# Patient Record
Sex: Female | Born: 1945 | Race: White | Hispanic: No | Smoking: Never smoker
Health system: Southern US, Community
[De-identification: ages and names within clinical notes are randomized; demographics above are authoritative.]

## PROBLEM LIST (undated history)

## (undated) DIAGNOSIS — I10 Essential (primary) hypertension: Secondary | ICD-10-CM

## (undated) DIAGNOSIS — I639 Cerebral infarction, unspecified: Secondary | ICD-10-CM

## (undated) DIAGNOSIS — I4891 Unspecified atrial fibrillation: Secondary | ICD-10-CM

## (undated) DIAGNOSIS — R0600 Dyspnea, unspecified: Secondary | ICD-10-CM

## (undated) DIAGNOSIS — M549 Dorsalgia, unspecified: Secondary | ICD-10-CM

## (undated) DIAGNOSIS — G4730 Sleep apnea, unspecified: Secondary | ICD-10-CM

## (undated) DIAGNOSIS — R569 Unspecified convulsions: Secondary | ICD-10-CM

## (undated) DIAGNOSIS — I499 Cardiac arrhythmia, unspecified: Secondary | ICD-10-CM

## (undated) HISTORY — DX: Unspecified atrial fibrillation: I48.91

## (undated) HISTORY — DX: Unspecified convulsions: R56.9

## (undated) HISTORY — DX: Sleep apnea, unspecified: G47.30

## (undated) HISTORY — DX: Cerebral infarction, unspecified: I63.9

## (undated) HISTORY — PX: SHOULDER ARTHROSCOPY: SHX128

## (undated) HISTORY — PX: OTHER SURGICAL HISTORY: SHX169

---

## 2005-02-21 ENCOUNTER — Ambulatory Visit: Payer: Self-pay | Admitting: Internal Medicine

## 2005-02-26 ENCOUNTER — Ambulatory Visit: Payer: Self-pay | Admitting: Internal Medicine

## 2005-05-09 ENCOUNTER — Ambulatory Visit: Payer: Self-pay | Admitting: Gastroenterology

## 2005-09-23 ENCOUNTER — Other Ambulatory Visit: Payer: Self-pay

## 2005-09-23 ENCOUNTER — Emergency Department: Payer: Self-pay | Admitting: Unknown Physician Specialty

## 2005-09-24 ENCOUNTER — Ambulatory Visit: Payer: Self-pay | Admitting: Unknown Physician Specialty

## 2006-06-24 ENCOUNTER — Other Ambulatory Visit: Payer: Self-pay

## 2006-06-24 ENCOUNTER — Emergency Department: Payer: Self-pay | Admitting: Emergency Medicine

## 2006-07-06 ENCOUNTER — Other Ambulatory Visit: Payer: Self-pay

## 2006-07-06 ENCOUNTER — Emergency Department: Payer: Self-pay | Admitting: Emergency Medicine

## 2006-07-15 ENCOUNTER — Emergency Department: Payer: Self-pay | Admitting: Emergency Medicine

## 2006-07-15 ENCOUNTER — Other Ambulatory Visit: Payer: Self-pay

## 2006-07-16 ENCOUNTER — Emergency Department: Payer: Self-pay | Admitting: Emergency Medicine

## 2006-07-17 ENCOUNTER — Ambulatory Visit: Payer: Self-pay | Admitting: Orthopaedic Surgery

## 2006-09-02 ENCOUNTER — Ambulatory Visit: Payer: Self-pay | Admitting: Orthopaedic Surgery

## 2006-09-11 ENCOUNTER — Ambulatory Visit: Payer: Self-pay | Admitting: Orthopaedic Surgery

## 2007-01-12 ENCOUNTER — Ambulatory Visit: Payer: Self-pay | Admitting: Family Medicine

## 2007-01-28 ENCOUNTER — Ambulatory Visit: Payer: Self-pay | Admitting: Neurology

## 2007-02-26 ENCOUNTER — Ambulatory Visit: Payer: Self-pay | Admitting: Neurology

## 2007-08-24 ENCOUNTER — Ambulatory Visit: Payer: Self-pay | Admitting: Internal Medicine

## 2008-07-27 IMAGING — CR DG CHEST 2V
1 series · 2 of 2 positions shown · non-contrast
Comparison: none

REASON FOR EXAM: shoulder pain on left
COMMENTS:

PROCEDURE:     DXR - DXR CHEST PA (OR AP) AND LATERAL  - July 06, 2006 [DATE]
RESULT:     Comparison to prior exam of 09/23/2005.
The lungs are clear. The cardiomediastinal silhouette is unchanged. There is
no pneumothorax or pleural effusion. No osseous changes are identified.

[Series 1: view not recorded · 0.17mm/px · 2 of 2 slices shown]
[im 1/2]
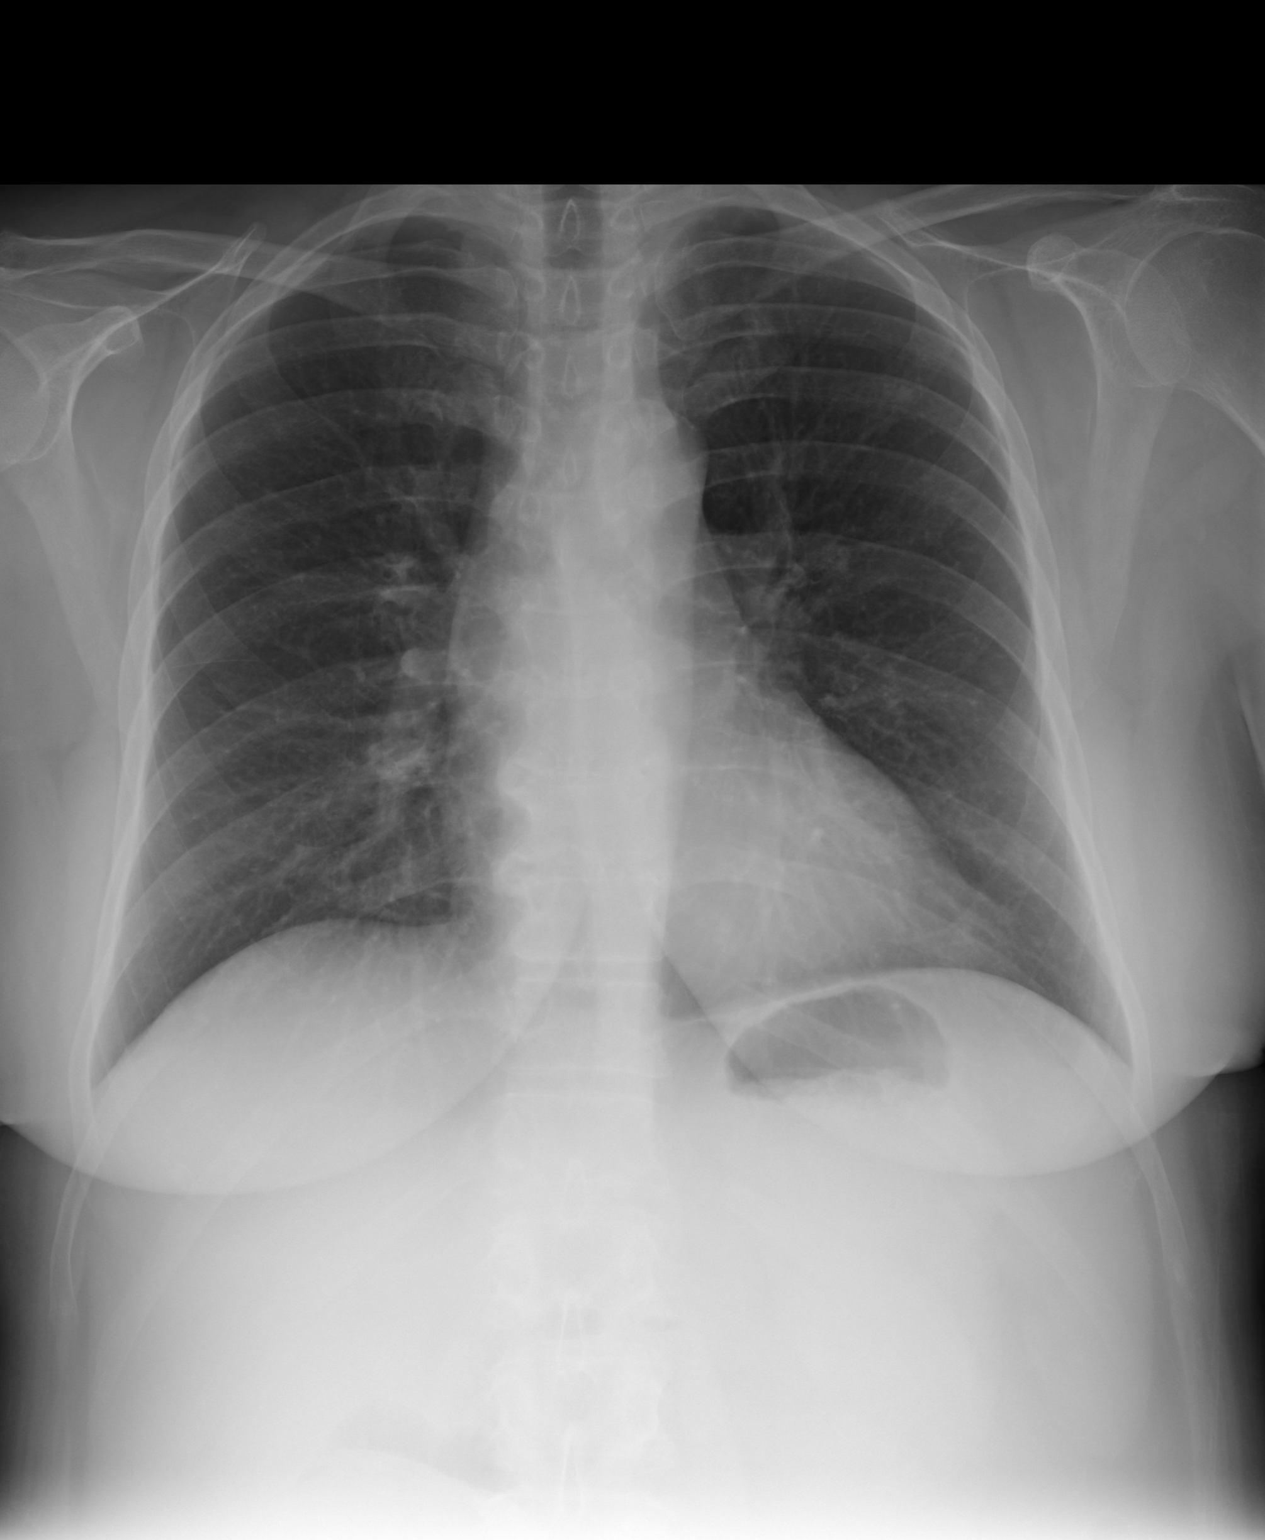
[im 2/2]
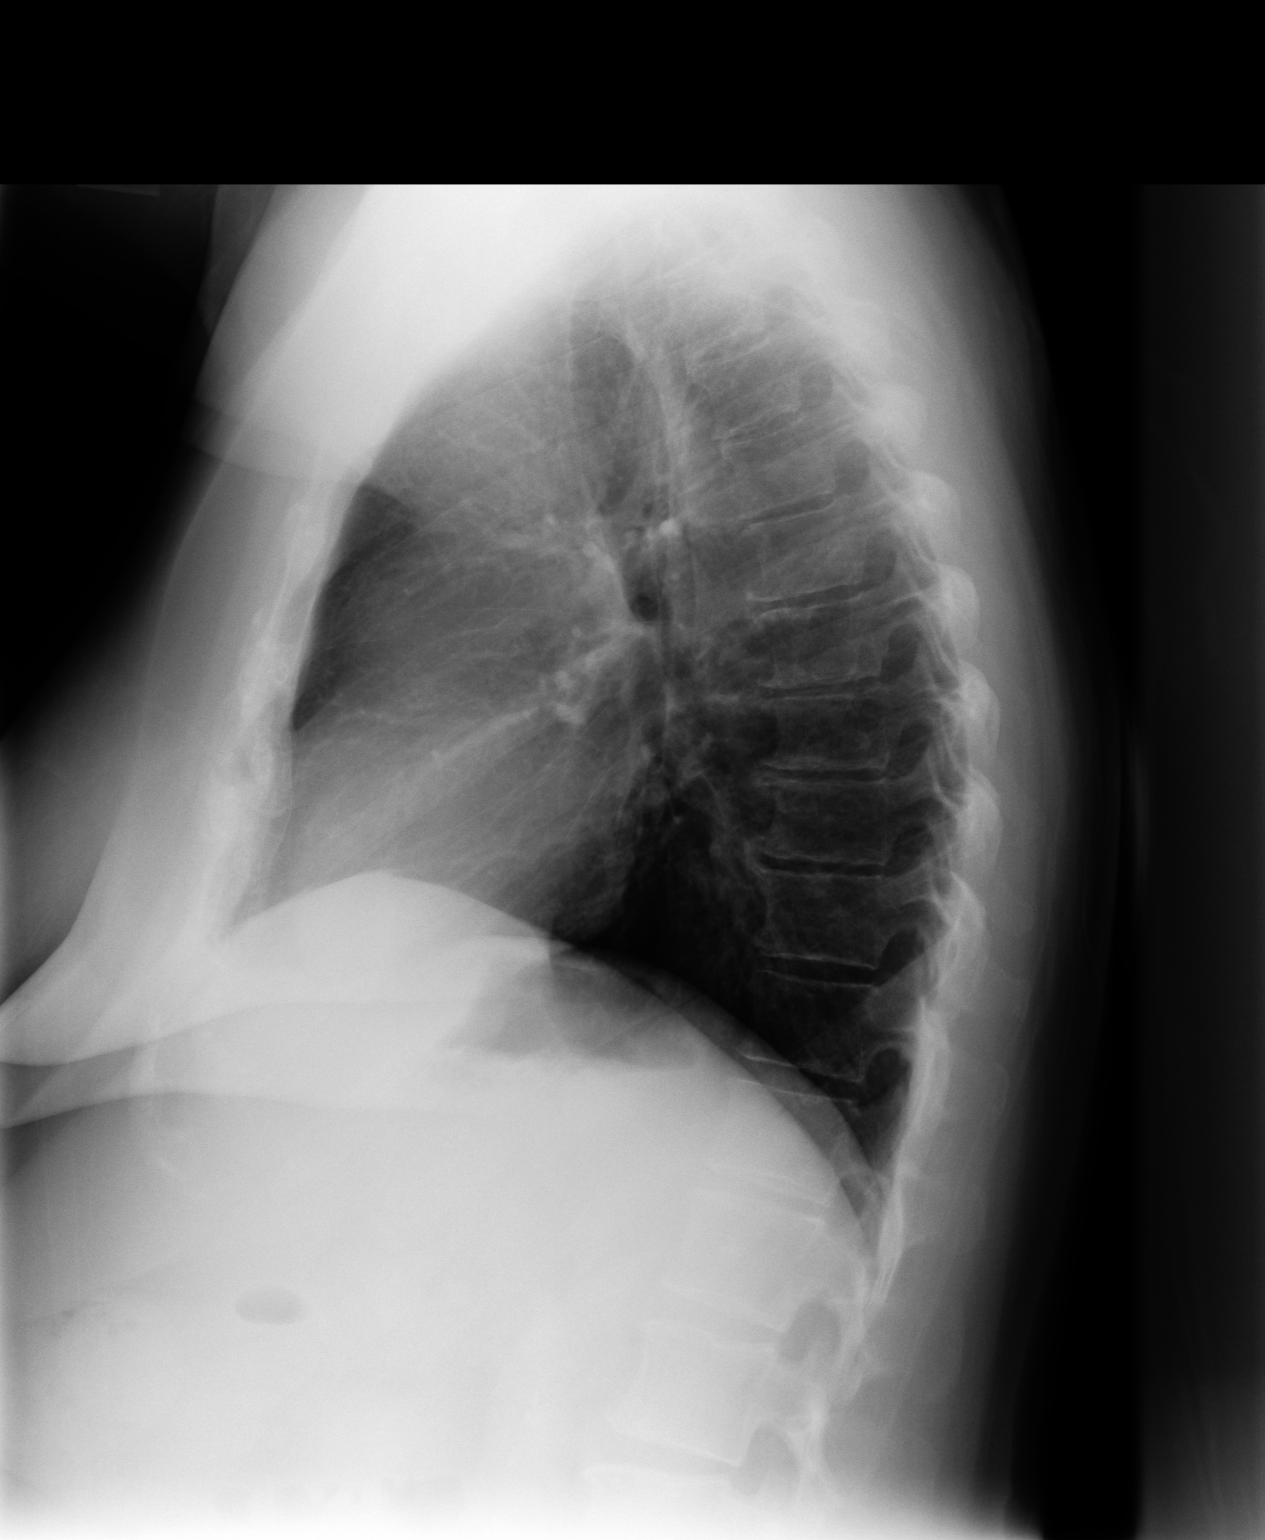

[2 of 2 positions shown; findings below may reference images not displayed]

IMPRESSION: No acute cardiopulmonary process identified.

## 2009-01-23 ENCOUNTER — Ambulatory Visit: Payer: Self-pay | Admitting: Family Medicine

## 2013-03-10 ENCOUNTER — Ambulatory Visit: Payer: Self-pay | Admitting: Family Medicine

## 2013-09-07 DIAGNOSIS — E049 Nontoxic goiter, unspecified: Secondary | ICD-10-CM | POA: Insufficient documentation

## 2013-09-07 DIAGNOSIS — I639 Cerebral infarction, unspecified: Secondary | ICD-10-CM | POA: Insufficient documentation

## 2013-09-07 DIAGNOSIS — I4891 Unspecified atrial fibrillation: Secondary | ICD-10-CM | POA: Insufficient documentation

## 2013-09-25 ENCOUNTER — Ambulatory Visit: Payer: Self-pay | Admitting: Physician Assistant

## 2013-09-25 LAB — URINALYSIS, COMPLETE
BILIRUBIN, UR: NEGATIVE
Glucose,UR: NEGATIVE mg/dL (ref 0–75)
Ketone: NEGATIVE
Nitrite: NEGATIVE
Ph: 7 (ref 4.5–8.0)
Protein: NEGATIVE
Specific Gravity: 1.005 (ref 1.003–1.030)

## 2013-09-27 LAB — URINE CULTURE

## 2014-08-25 ENCOUNTER — Other Ambulatory Visit: Payer: Self-pay | Admitting: Family Medicine

## 2014-08-25 DIAGNOSIS — Z1231 Encounter for screening mammogram for malignant neoplasm of breast: Secondary | ICD-10-CM

## 2014-09-14 ENCOUNTER — Ambulatory Visit
Admission: RE | Admit: 2014-09-14 | Discharge: 2014-09-14 | Disposition: A | Discharge status: Home / Self Care | Payer: BLUE CROSS/BLUE SHIELD | Source: Ambulatory Visit | Attending: Family Medicine | Admitting: Family Medicine

## 2014-09-14 DIAGNOSIS — Z1231 Encounter for screening mammogram for malignant neoplasm of breast: Secondary | ICD-10-CM

## 2016-07-01 DIAGNOSIS — R569 Unspecified convulsions: Secondary | ICD-10-CM | POA: Insufficient documentation

## 2016-08-26 ENCOUNTER — Other Ambulatory Visit: Payer: Self-pay | Admitting: Family Medicine

## 2016-08-26 DIAGNOSIS — Z1231 Encounter for screening mammogram for malignant neoplasm of breast: Secondary | ICD-10-CM

## 2016-09-10 ENCOUNTER — Ambulatory Visit
Admission: RE | Admit: 2016-09-10 | Discharge: 2016-09-10 | Disposition: A | Discharge status: Home / Self Care | Payer: Medicare HMO | Source: Ambulatory Visit | Attending: Family Medicine | Admitting: Family Medicine

## 2016-09-10 DIAGNOSIS — Z1231 Encounter for screening mammogram for malignant neoplasm of breast: Secondary | ICD-10-CM | POA: Diagnosis not present

## 2017-03-10 ENCOUNTER — Ambulatory Visit: Payer: Medicare Other | Admitting: Anesthesiology

## 2017-03-10 ENCOUNTER — Ambulatory Visit
Admission: RE | Admit: 2017-03-10 | Discharge: 2017-03-10 | Disposition: A | Discharge status: Home / Self Care | Payer: Medicare Other | Source: Ambulatory Visit | Attending: Gastroenterology | Admitting: Gastroenterology

## 2017-03-10 ENCOUNTER — Encounter
Admission: RE | Disposition: A | Discharge status: Home / Self Care | Payer: Self-pay | Source: Ambulatory Visit | Attending: Gastroenterology

## 2017-03-10 DIAGNOSIS — I10 Essential (primary) hypertension: Secondary | ICD-10-CM | POA: Insufficient documentation

## 2017-03-10 DIAGNOSIS — D120 Benign neoplasm of cecum: Secondary | ICD-10-CM | POA: Insufficient documentation

## 2017-03-10 DIAGNOSIS — K5730 Diverticulosis of large intestine without perforation or abscess without bleeding: Secondary | ICD-10-CM | POA: Diagnosis not present

## 2017-03-10 DIAGNOSIS — I4891 Unspecified atrial fibrillation: Secondary | ICD-10-CM | POA: Insufficient documentation

## 2017-03-10 DIAGNOSIS — D122 Benign neoplasm of ascending colon: Secondary | ICD-10-CM | POA: Diagnosis not present

## 2017-03-10 DIAGNOSIS — Z8371 Family history of colonic polyps: Secondary | ICD-10-CM | POA: Insufficient documentation

## 2017-03-10 DIAGNOSIS — Z1211 Encounter for screening for malignant neoplasm of colon: Secondary | ICD-10-CM | POA: Diagnosis present

## 2017-03-10 DIAGNOSIS — D123 Benign neoplasm of transverse colon: Secondary | ICD-10-CM | POA: Diagnosis not present

## 2017-03-10 DIAGNOSIS — Z7984 Long term (current) use of oral hypoglycemic drugs: Secondary | ICD-10-CM | POA: Insufficient documentation

## 2017-03-10 DIAGNOSIS — E119 Type 2 diabetes mellitus without complications: Secondary | ICD-10-CM | POA: Diagnosis not present

## 2017-03-10 DIAGNOSIS — Z86010 Personal history of colonic polyps: Secondary | ICD-10-CM | POA: Insufficient documentation

## 2017-03-10 DIAGNOSIS — Z79899 Other long term (current) drug therapy: Secondary | ICD-10-CM | POA: Insufficient documentation

## 2017-03-10 HISTORY — PX: COLONOSCOPY WITH PROPOFOL: SHX5780

## 2017-03-10 LAB — GLUCOSE, CAPILLARY: GLUCOSE-CAPILLARY: 104 mg/dL — AB (ref 65–99)

## 2017-03-10 SURGERY — COLONOSCOPY WITH PROPOFOL
Anesthesia: General

## 2017-03-10 MED ORDER — FENTANYL CITRATE (PF) 100 MCG/2ML IJ SOLN
INTRAMUSCULAR | Status: AC
  Filled 2017-03-10: qty 2

## 2017-03-10 MED ORDER — PROPOFOL 500 MG/50ML IV EMUL
INTRAVENOUS | Status: AC
  Filled 2017-03-10: qty 50

## 2017-03-10 MED ORDER — FENTANYL CITRATE (PF) 100 MCG/2ML IJ SOLN
INTRAMUSCULAR | Status: DC | PRN
  Administered 2017-03-10: 25 ug via INTRAVENOUS

## 2017-03-10 MED ORDER — SODIUM CHLORIDE 0.9 % IV SOLN: INTRAVENOUS | Status: DC

## 2017-03-10 MED ORDER — PROPOFOL 500 MG/50ML IV EMUL
INTRAVENOUS | Status: DC | PRN
  Administered 2017-03-10: 120 ug/kg/min via INTRAVENOUS
  Administered 2017-03-10: 150 ug/kg/min via INTRAVENOUS

## 2017-03-10 MED ORDER — PROPOFOL 10 MG/ML IV BOLUS
INTRAVENOUS | Status: DC | PRN
  Administered 2017-03-10: 40 mg via INTRAVENOUS
  Administered 2017-03-10: 30 mg via INTRAVENOUS
  Administered 2017-03-10: 40 mg via INTRAVENOUS

## 2017-03-10 MED ORDER — SODIUM CHLORIDE 0.9 % IV SOLN
INTRAVENOUS | Status: DC
  Administered 2017-03-10: 1000 mL via INTRAVENOUS
  Administered 2017-03-10: 10:00:00 via INTRAVENOUS

## 2017-03-10 MED ORDER — PHENYLEPHRINE HCL 10 MG/ML IJ SOLN
INTRAMUSCULAR | Status: DC | PRN
  Administered 2017-03-10 (×2): 100 ug via INTRAVENOUS

## 2017-03-10 MED ORDER — MIDAZOLAM HCL 2 MG/2ML IJ SOLN
INTRAMUSCULAR | Status: DC | PRN
  Administered 2017-03-10: .5 mg via INTRAVENOUS

## 2017-03-10 MED ORDER — MIDAZOLAM HCL 2 MG/2ML IJ SOLN
INTRAMUSCULAR | Status: AC
  Filled 2017-03-10: qty 2

## 2017-03-10 MED ORDER — EPHEDRINE SULFATE 50 MG/ML IJ SOLN
INTRAMUSCULAR | Status: DC | PRN
  Administered 2017-03-10: 20 mg via INTRAVENOUS
  Administered 2017-03-10: 10 mg via INTRAVENOUS

## 2017-03-10 NOTE — Transfer of Care (Signed)
Immediate Anesthesia Transfer of Care Note  Patient: Autumn Herman  Procedure(s) Performed: COLONOSCOPY WITH PROPOFOL (N/A )  Patient Location: PACU  Anesthesia Type:General  Level of Consciousness: awake  Airway & Oxygen Therapy: Patient Spontanous Breathing and Patient connected to face mask oxygen  Post-op Assessment: Report given to RN and Post -op Vital signs reviewed and stable  Post vital signs: Reviewed and stable  Last Vitals:  Vitals:   03/10/17 0904  BP: (!) 137/59  Pulse: 83  Resp: 20  Temp: (!) 35.6 C  SpO2: 100%    Last Pain:  Vitals:   03/10/17 0904  TempSrc: Tympanic         Complications: No apparent anesthesia complications

## 2017-03-10 NOTE — H&P (Signed)
Outpatient short stay form Pre-procedure 03/10/2017 9:29 AM Autumn Sails MD  Primary Physician: Eulogio Bear NP  Reason for visit:  Colonoscopy  History of present illness:  Patient is a 72 year old female presenting today as above. She has a personal history of adenomatous colon polyps with her last colonoscopy being 2007. Her mother also had colon polyps. She does take eliquis for atrial fibrillation however is felt that for over 3 days. She tolerated her prep well. She takes no other blood thinning agents or aspirin.    Current Facility-Administered Medications:  .  0.9 %  sodium chloride infusion, , Intravenous, Continuous, Autumn Sails, MD, Last Rate: 20 mL/hr at 03/10/17 0917, 1,000 mL at 03/10/17 0917 .  0.9 %  sodium chloride infusion, , Intravenous, Continuous, Autumn Sails, MD  Medications Prior to Admission  Medication Sig Dispense Refill Last Dose  . amLODipine (NORVASC) 5 MG tablet Take 5 mg by mouth daily.   03/10/2017 at 0630  . apixaban (ELIQUIS) 5 MG TABS tablet Take 5 mg by mouth 2 (two) times daily.   Past Week at Unknown time  . atorvastatin (LIPITOR) 80 MG tablet Take 80 mg by mouth daily.   03/09/2017 at Unknown time  . gabapentin (NEURONTIN) 300 MG capsule Take 300 mg by mouth 3 (three) times daily.   03/09/2017 at Unknown time  . glipiZIDE (GLUCOTROL) 5 MG tablet Take by mouth daily before breakfast.   Past Week at Unknown time  . lisinopril (PRINIVIL,ZESTRIL) 20 MG tablet Take 20 mg by mouth daily.   03/10/2017 at 0630     Allergies  Allergen Reactions  . Naproxen   . Prozac [Fluoxetine Hcl]      No past medical history on file.  Review of systems:      Physical Exam    Heart and lungs: Irregular rate and rhythm without rub or gallop, lungs are bilaterally clear    HEENT: Normocephalic atraumatic eyes are anicteric    Other:     Pertinant exam for procedure: Soft nontender nondistended bowel sounds positive  normoactive    Planned proceedures: Colonoscopy and indicated procedures. I have discussed the risks benefits and complications of procedures to include not limited to bleeding, infection, perforation and the risk of sedation and the patient wishes to proceed.    Autumn Sails, MD Gastroenterology 03/10/2017  9:29 AM

## 2017-03-10 NOTE — Anesthesia Preprocedure Evaluation (Signed)
Anesthesia Evaluation  Patient identified by MRN, date of birth, ID band Patient awake    Reviewed: Allergy & Precautions, NPO status , Patient's Chart, lab work & pertinent test results  History of Anesthesia Complications Negative for: history of anesthetic complications  Airway Mallampati: II  TM Distance: >3 FB Neck ROM: Full    Dental no notable dental hx.    Pulmonary sleep apnea and Continuous Positive Airway Pressure Ventilation , neg COPD,    breath sounds clear to auscultation- rhonchi (-) wheezing      Cardiovascular Exercise Tolerance: Good hypertension, Pt. on medications (-) CAD, (-) Past MI and (-) Cardiac Stents  Rhythm:Regular Rate:Normal - Systolic murmurs and - Diastolic murmurs    Neuro/Psych negative neurological ROS  negative psych ROS   GI/Hepatic negative GI ROS, Neg liver ROS,   Endo/Other  diabetes, Oral Hypoglycemic Agents  Renal/GU negative Renal ROS     Musculoskeletal negative musculoskeletal ROS (+)   Abdominal (+) + obese,   Peds  Hematology negative hematology ROS (+)   Anesthesia Other Findings    Reproductive/Obstetrics                             Anesthesia Physical Anesthesia Plan  ASA: III  Anesthesia Plan: General   Post-op Pain Management:    Induction: Intravenous  PONV Risk Score and Plan: 2 and Propofol infusion  Airway Management Planned: Natural Airway  Additional Equipment:   Intra-op Plan:   Post-operative Plan:   Informed Consent: I have reviewed the patients History and Physical, chart, labs and discussed the procedure including the risks, benefits and alternatives for the proposed anesthesia with the patient or authorized representative who has indicated his/her understanding and acceptance.   Dental advisory given  Plan Discussed with: CRNA and Anesthesiologist  Anesthesia Plan Comments:         Anesthesia Quick  Evaluation

## 2017-03-10 NOTE — Op Note (Signed)
[facility] Gastroenterology Patient Name: Autumn Herman Procedure Date: 03/10/2017 9:43 AM MRN: [id number] Account #: [id number] Date of Birth: 06/04/1945 Admit Type: Outpatient Age: 72 Room: [facility] ENDO ROOM 3 Gender: Female Note Status: Finalized Procedure:            Colonoscopy Indications:          Colon cancer screening in patient at increased risk:                        Family history of 1st-degree relative with colon polyps Providers:            Lollie Sails, MD Referring MD:         Dani Gobble. Dema Severin, MD (Referring MD) Medicines:            Monitored Anesthesia Care Complications:        No immediate complications. Procedure:            Pre-Anesthesia Assessment:                       - ASA Grade Assessment: III - A patient with severe                        systemic disease.                       After obtaining informed consent, the colonoscope was                        passed under direct vision. Throughout the procedure,                        the patient's blood pressure, pulse, and oxygen                        saturations were monitored continuously. The                        Colonoscope was introduced through the anus and                        advanced to the the cecum, identified by appendiceal                        orifice and ileocecal valve. The quality of the bowel                        preparation was fair. Findings:      Multiple small-mouthed diverticula were found in the sigmoid colon and       descending colon.      A 4 mm polyp was found in the transverse colon. The polyp was sessile.       The polyp was removed with a cold snare. Resection and retrieval were       complete.      A 2 mm polyp was found in the transverse colon. The polyp was sessile.       The polyp was removed with a cold biopsy forceps. Resection and       retrieval were complete.      A 14 mm polyp was found in the mid ascending colon. The polyp was  sessile. Polypectomy was attempted, initially using a lift and cut       technique with a cold snare. Polyp resection was incomplete with this       device. This intervention then required a different device and       polypectomy technique. The polyp was removed with a cold biopsy forceps.       Resection and retrieval were complete.      A 5 mm polyp was found in the cecum. The polyp was sessile. The polyp       was removed with a piecemeal technique using a cold biopsy forceps.       Resection and retrieval were complete.      A 3 mm polyp was found in the splenic flexure. The polyp was sessile.       The polyp was removed with a cold biopsy forceps. Resection and       retrieval were complete.      The retroflexed view of the distal rectum and anal verge was normal and       showed no anal or rectal abnormalities.      The digital rectal exam was normal.      There were 3 medium-sized lipoma, 6 to 18 mm in diameter, in the       descending colon and in the transverse colon, positive pillow sign. Impression:           - Preparation of the colon was fair.                       - Diverticulosis in the sigmoid colon and in the                        descending colon.                       - One 4 mm polyp in the transverse colon, removed with                        a cold snare. Resected and retrieved.                       - One 2 mm polyp in the transverse colon, removed with                        a cold biopsy forceps. Resected and retrieved.                       - One 14 mm polyp in the mid ascending colon, removed                        with a cold biopsy forceps. Resected and retrieved.                       - One 5 mm polyp in the cecum, removed piecemeal using                        a cold biopsy forceps. Resected and retrieved.                       - One 3 mm polyp at the splenic flexure, removed with a  cold biopsy forceps. Resected and retrieved.                        - The distal rectum and anal verge are normal on                        retroflexion view. Recommendation:       - Discharge patient to home.                       - Telephone GI clinic for pathology results in 1 week.                       - Await pathology results. Procedure Code(s):    --- Professional ---                       [phone], Colonoscopy, flexible; with removal of tumor(s),                        polyp(s), or other lesion(s) by snare technique                       [id number], 6, Colonoscopy, flexible; with biopsy, single                        or multiple Diagnosis Code(s):    --- Professional ---                       Z83.71, Family history of colonic polyps                       D12.2, Benign neoplasm of ascending colon                       D12.0, Benign neoplasm of cecum                       D12.3, Benign neoplasm of transverse colon (hepatic                        flexure or splenic flexure)                       K57.30, Diverticulosis of large intestine without                        perforation or abscess without bleeding CPT copyright 2016 [employer]. All rights reserved. The codes documented in this report are preliminary and upon coder review may  be revised to meet current compliance requirements. Lollie Sails, MD 03/10/2017 10:53:01 AM This report has been signed electronically. Number of Addenda: 0 Note Initiated On: 03/10/2017 9:43 AM Scope Withdrawal Time: 0 hours 12 minutes 31 seconds  Total Procedure Duration: 0 hours 51 minutes 55 seconds       [facility]

## 2017-03-10 NOTE — Anesthesia Postprocedure Evaluation (Signed)
Anesthesia Post Note  Patient: Kathrynn Humble Maragh  Procedure(s) Performed: COLONOSCOPY WITH PROPOFOL (N/A )  Patient location during evaluation: Endoscopy Anesthesia Type: General Level of consciousness: awake and alert and oriented Pain management: pain level controlled Vital Signs Assessment: post-procedure vital signs reviewed and stable Respiratory status: spontaneous breathing, nonlabored ventilation and respiratory function stable Cardiovascular status: blood pressure returned to baseline and stable Postop Assessment: no signs of nausea or vomiting Anesthetic complications: no     Last Vitals:  Vitals:   03/10/17 1120 03/10/17 1130  BP: (!) 135/58 129/61  Pulse: (!) 7   Resp: 18 (!) 23  Temp:    SpO2: 100% 100%    Last Pain:  Vitals:   03/10/17 1130  TempSrc:   PainSc: 0-No pain                 Chesky Heyer

## 2017-03-10 NOTE — Anesthesia Post-op Follow-up Note (Signed)
Anesthesia QCDR form completed.        

## 2017-03-11 ENCOUNTER — Encounter: Payer: Self-pay | Admitting: Gastroenterology

## 2017-03-12 LAB — SURGICAL PATHOLOGY

## 2018-03-09 ENCOUNTER — Other Ambulatory Visit (HOSPITAL_COMMUNITY): Payer: Self-pay | Admitting: Orthopedic Surgery

## 2018-03-09 ENCOUNTER — Other Ambulatory Visit: Payer: Self-pay | Admitting: Orthopedic Surgery

## 2018-03-09 DIAGNOSIS — M5136 Other intervertebral disc degeneration, lumbar region: Secondary | ICD-10-CM

## 2018-03-09 DIAGNOSIS — M4316 Spondylolisthesis, lumbar region: Secondary | ICD-10-CM

## 2018-03-09 DIAGNOSIS — M51369 Other intervertebral disc degeneration, lumbar region without mention of lumbar back pain or lower extremity pain: Secondary | ICD-10-CM

## 2018-03-09 DIAGNOSIS — M48062 Spinal stenosis, lumbar region with neurogenic claudication: Secondary | ICD-10-CM

## 2018-03-09 DIAGNOSIS — M48061 Spinal stenosis, lumbar region without neurogenic claudication: Secondary | ICD-10-CM

## 2018-03-10 ENCOUNTER — Other Ambulatory Visit: Payer: Self-pay | Admitting: Orthopedic Surgery

## 2018-03-10 DIAGNOSIS — M48062 Spinal stenosis, lumbar region with neurogenic claudication: Secondary | ICD-10-CM

## 2018-03-10 DIAGNOSIS — M4316 Spondylolisthesis, lumbar region: Secondary | ICD-10-CM

## 2018-03-10 DIAGNOSIS — M5136 Other intervertebral disc degeneration, lumbar region: Secondary | ICD-10-CM

## 2018-03-10 DIAGNOSIS — M51369 Other intervertebral disc degeneration, lumbar region without mention of lumbar back pain or lower extremity pain: Secondary | ICD-10-CM

## 2018-03-10 DIAGNOSIS — M48061 Spinal stenosis, lumbar region without neurogenic claudication: Secondary | ICD-10-CM

## 2018-03-27 ENCOUNTER — Encounter (INDEPENDENT_AMBULATORY_CARE_PROVIDER_SITE_OTHER): Payer: Self-pay

## 2018-03-27 ENCOUNTER — Ambulatory Visit
Admission: RE | Admit: 2018-03-27 | Discharge: 2018-03-27 | Disposition: A | Discharge status: Home / Self Care | Payer: Medicare Other | Source: Ambulatory Visit | Attending: Orthopedic Surgery | Admitting: Orthopedic Surgery

## 2018-03-27 DIAGNOSIS — M51369 Other intervertebral disc degeneration, lumbar region without mention of lumbar back pain or lower extremity pain: Secondary | ICD-10-CM

## 2018-03-27 DIAGNOSIS — M5136 Other intervertebral disc degeneration, lumbar region: Secondary | ICD-10-CM | POA: Insufficient documentation

## 2018-03-27 DIAGNOSIS — M48062 Spinal stenosis, lumbar region with neurogenic claudication: Secondary | ICD-10-CM | POA: Diagnosis present

## 2018-03-27 DIAGNOSIS — M48061 Spinal stenosis, lumbar region without neurogenic claudication: Secondary | ICD-10-CM

## 2018-03-27 DIAGNOSIS — M4316 Spondylolisthesis, lumbar region: Secondary | ICD-10-CM | POA: Diagnosis present

## 2018-07-14 ENCOUNTER — Other Ambulatory Visit: Payer: Self-pay | Admitting: Family Medicine

## 2018-07-14 DIAGNOSIS — Z1231 Encounter for screening mammogram for malignant neoplasm of breast: Secondary | ICD-10-CM

## 2018-07-24 ENCOUNTER — Ambulatory Visit
Admission: RE | Admit: 2018-07-24 | Discharge: 2018-07-24 | Disposition: A | Discharge status: Home / Self Care | Payer: Medicare Other | Source: Ambulatory Visit | Attending: Family Medicine | Admitting: Family Medicine

## 2018-07-24 ENCOUNTER — Encounter (INDEPENDENT_AMBULATORY_CARE_PROVIDER_SITE_OTHER): Payer: Self-pay

## 2018-07-24 ENCOUNTER — Other Ambulatory Visit: Payer: Self-pay

## 2018-07-24 DIAGNOSIS — Z1231 Encounter for screening mammogram for malignant neoplasm of breast: Secondary | ICD-10-CM | POA: Diagnosis present

## 2018-09-22 ENCOUNTER — Other Ambulatory Visit: Payer: Self-pay | Admitting: Neurosurgery

## 2018-10-21 ENCOUNTER — Inpatient Hospital Stay: Admission: RE | Admit: 2018-10-21 | Payer: Medicare Other | Source: Ambulatory Visit

## 2018-10-23 ENCOUNTER — Other Ambulatory Visit: Payer: Self-pay

## 2018-10-23 ENCOUNTER — Encounter
Admission: RE | Admit: 2018-10-23 | Discharge: 2018-10-23 | Disposition: A | Discharge status: Home / Self Care | Payer: Medicare Other | Source: Ambulatory Visit | Attending: Neurosurgery | Admitting: Neurosurgery

## 2018-10-23 DIAGNOSIS — M48062 Spinal stenosis, lumbar region with neurogenic claudication: Secondary | ICD-10-CM | POA: Diagnosis not present

## 2018-10-23 DIAGNOSIS — I4510 Unspecified right bundle-branch block: Secondary | ICD-10-CM | POA: Diagnosis not present

## 2018-10-23 DIAGNOSIS — R9431 Abnormal electrocardiogram [ECG] [EKG]: Secondary | ICD-10-CM | POA: Insufficient documentation

## 2018-10-23 DIAGNOSIS — I10 Essential (primary) hypertension: Secondary | ICD-10-CM | POA: Insufficient documentation

## 2018-10-23 DIAGNOSIS — Z20828 Contact with and (suspected) exposure to other viral communicable diseases: Secondary | ICD-10-CM | POA: Diagnosis not present

## 2018-10-23 DIAGNOSIS — M4316 Spondylolisthesis, lumbar region: Secondary | ICD-10-CM | POA: Diagnosis not present

## 2018-10-23 DIAGNOSIS — Z01818 Encounter for other preprocedural examination: Secondary | ICD-10-CM | POA: Insufficient documentation

## 2018-10-23 HISTORY — DX: Dyspnea, unspecified: R06.00

## 2018-10-23 HISTORY — DX: Cardiac arrhythmia, unspecified: I49.9

## 2018-10-23 HISTORY — DX: Dorsalgia, unspecified: M54.9

## 2018-10-23 HISTORY — DX: Essential (primary) hypertension: I10

## 2018-10-23 LAB — PROTIME-INR
INR: 1 (ref 0.8–1.2)
Prothrombin Time: 12.6 seconds (ref 11.4–15.2)

## 2018-10-23 LAB — BASIC METABOLIC PANEL
Anion gap: 11 (ref 5–15)
BUN: 17 mg/dL (ref 8–23)
CO2: 30 mmol/L (ref 22–32)
Calcium: 9.4 mg/dL (ref 8.9–10.3)
Chloride: 94 mmol/L — ABNORMAL LOW (ref 98–111)
Creatinine, Ser: 1.08 mg/dL — ABNORMAL HIGH (ref 0.44–1.00)
GFR calc Af Amer: 59 mL/min — ABNORMAL LOW (ref >60)
GFR calc non Af Amer: 51 mL/min — ABNORMAL LOW (ref >60)
Glucose, Bld: 127 mg/dL — ABNORMAL HIGH (ref 70–99)
Potassium: 3.5 mmol/L (ref 3.5–5.1)
Sodium: 135 mmol/L (ref 135–145)

## 2018-10-23 LAB — URINALYSIS, ROUTINE W REFLEX MICROSCOPIC
Bilirubin Urine: NEGATIVE
Glucose, UA: NEGATIVE mg/dL
Hgb urine dipstick: NEGATIVE
Ketones, ur: NEGATIVE mg/dL
Leukocytes,Ua: NEGATIVE
Nitrite: NEGATIVE
Protein, ur: NEGATIVE mg/dL
Specific Gravity, Urine: 1.009 (ref 1.005–1.030)
pH: 5 (ref 5.0–8.0)

## 2018-10-23 LAB — TYPE AND SCREEN
ABO/RH(D): O POS
Antibody Screen: NEGATIVE

## 2018-10-23 LAB — HEMOGLOBIN A1C
Hgb A1c MFr Bld: 7 % — ABNORMAL HIGH (ref 4.8–5.6)
Mean Plasma Glucose: 154.2 mg/dL

## 2018-10-23 LAB — CBC
HCT: 35.1 % — ABNORMAL LOW (ref 36.0–46.0)
Hemoglobin: 11.5 g/dL — ABNORMAL LOW (ref 12.0–15.0)
MCH: 29.4 pg (ref 26.0–34.0)
MCHC: 32.8 g/dL (ref 30.0–36.0)
MCV: 89.8 fL (ref 80.0–100.0)
Platelets: 360 10*3/uL (ref 150–400)
RBC: 3.91 MIL/uL (ref 3.87–5.11)
RDW: 14.6 % (ref 11.5–15.5)
WBC: 6.7 10*3/uL (ref 4.0–10.5)
nRBC: 0 % (ref 0.0–0.2)

## 2018-10-23 LAB — SURGICAL PCR SCREEN
MRSA, PCR: NEGATIVE
Staphylococcus aureus: NEGATIVE

## 2018-10-23 LAB — SARS CORONAVIRUS 2 (TAT 6-24 HRS): SARS Coronavirus 2: NEGATIVE

## 2018-10-23 LAB — APTT: aPTT: 31 seconds (ref 24–36)

## 2018-10-23 NOTE — Patient Instructions (Addendum)
Your procedure is scheduled on: 10/28/2018 Wed Report to Same Day Surgery 2nd floor medical mall [facility] Entrance-take elevator on left to 2nd floor.  Check in with surgery information desk.) To find out your arrival time please call [phone] between 1PM - 3PM on 10/27/2018 Tues  Remember: Instructions that are not followed completely may result in serious medical risk, up to and including death, or upon the discretion of your surgeon and anesthesiologist your surgery may need to be rescheduled.    _x___ 1. Do not eat food after midnight the night before your procedure. You may drink clear liquids up to 2 hours before you are scheduled to arrive at the hospital for your procedure.  Do not drink clear liquids within 2 hours of your scheduled arrival to the hospital.  Clear liquids include  --Water or Apple juice without pulp  --Clear carbohydrate beverage such as ClearFast or Gatorade  --Black Coffee or Clear Tea (No milk, no creamers, do not add anything to                  the coffee or Tea Type 1 and type 2 diabetics should only drink water.   ____Ensure clear carbohydrate drink on the way to the hospital for bariatric patients  ____Ensure clear carbohydrate drink 3 hours before surgery.   No gum chewing or hard candies.     __x__ 2. No Alcohol for 24 hours before or after surgery.   __x__3. No Smoking or e-cigarettes for 24 prior to surgery.  Do not use any chewable tobacco products for at least 6 hour prior to surgery   ____  4. Bring all medications with you on the day of surgery if instructed.    __x__ 5. Notify your doctor if there is any change in your medical condition     (cold, fever, infections).    x___6. On the morning of surgery brush your teeth with toothpaste and water.  You may rinse your mouth with mouth wash if you wish.  Do not swallow any toothpaste or mouthwash.   Do not wear jewelry, make-up, hairpins, clips or nail polish.  Do not wear lotions,  powders, or perfumes. You may wear deodorant.  Do not shave 48 hours prior to surgery. Men may shave face and neck.  Do not bring valuables to the hospital.    [facility] is not responsible for any belongings or valuables.               Contacts, dentures or bridgework may not be worn into surgery.  Leave your suitcase in the car. After surgery it may be brought to your room.  For patients admitted to the hospital, discharge time is determined by your                       treatment team.  _  Patients discharged the day of surgery will not be allowed to drive home.  You will need someone to drive you home and stay with you the night of your procedure.    Please read over the following fact sheets that you were given:   [facility] Preparing for Surgery and or MRSA Information   _x___ Take anti-hypertensive listed below, cardiac, seizure, asthma,     anti-reflux and psychiatric medicines. These include:  1. gabapentin (NEURONTIN  2.  3.  4.  5.  6.  ____Fleets enema or Magnesium Citrate as directed.   _x___ Use CHG Soap  or sage wipes as directed on instruction sheet   ____ Use inhalers on the day of surgery and bring to hospital day of surgery  _x___ Stop Metformin and Janumet 2 days prior to surgery.    ____ Take 1/2 of usual insulin dose the night before surgery and none on the morning     surgery.   _x___ Follow recommendations from Cardiologist, Pulmonologist or PCP regarding          stopping Aspirin, Coumadin, Plavix ,Eliquis, Effient, or Pradaxa, and Pletal.  X____Stop Anti-inflammatories such as Advil, Aleve, Ibuprofen, Motrin, Naproxen, Naprosyn, Goodies powders or aspirin products. OK to take Tylenol and                          Celebrex.   _x___ Stop supplements until after surgery.  But may continue Vitamin D, Vitamin B,       and multivitamin.  Stop fish oil today.   ____ Bring C-Pap to the hospital.

## 2018-10-28 ENCOUNTER — Inpatient Hospital Stay: Payer: Medicare Other

## 2018-10-28 ENCOUNTER — Inpatient Hospital Stay: Payer: Medicare Other | Admitting: Anesthesiology

## 2018-10-28 ENCOUNTER — Other Ambulatory Visit: Payer: Self-pay

## 2018-10-28 ENCOUNTER — Encounter: Payer: Self-pay | Admitting: *Deleted

## 2018-10-28 ENCOUNTER — Encounter
Admission: RE | Disposition: A | Discharge status: Home / Self Care | Payer: Self-pay | Source: Home / Self Care | Attending: Neurosurgery

## 2018-10-28 ENCOUNTER — Inpatient Hospital Stay
Admission: RE | Admit: 2018-10-28 | Discharge: 2018-10-29 | DRG: 455 | Disposition: A | Discharge status: Home / Self Care | Payer: Medicare Other | Attending: Neurosurgery | Admitting: Neurosurgery

## 2018-10-28 DIAGNOSIS — M5126 Other intervertebral disc displacement, lumbar region: Secondary | ICD-10-CM | POA: Diagnosis present

## 2018-10-28 DIAGNOSIS — E785 Hyperlipidemia, unspecified: Secondary | ICD-10-CM | POA: Diagnosis present

## 2018-10-28 DIAGNOSIS — M47816 Spondylosis without myelopathy or radiculopathy, lumbar region: Secondary | ICD-10-CM | POA: Diagnosis present

## 2018-10-28 DIAGNOSIS — D649 Anemia, unspecified: Secondary | ICD-10-CM | POA: Diagnosis present

## 2018-10-28 DIAGNOSIS — Z801 Family history of malignant neoplasm of trachea, bronchus and lung: Secondary | ICD-10-CM

## 2018-10-28 DIAGNOSIS — Z419 Encounter for procedure for purposes other than remedying health state, unspecified: Secondary | ICD-10-CM

## 2018-10-28 DIAGNOSIS — Z981 Arthrodesis status: Secondary | ICD-10-CM

## 2018-10-28 DIAGNOSIS — I10 Essential (primary) hypertension: Secondary | ICD-10-CM | POA: Diagnosis present

## 2018-10-28 DIAGNOSIS — G4730 Sleep apnea, unspecified: Secondary | ICD-10-CM | POA: Diagnosis present

## 2018-10-28 DIAGNOSIS — E119 Type 2 diabetes mellitus without complications: Secondary | ICD-10-CM | POA: Diagnosis present

## 2018-10-28 DIAGNOSIS — Z8719 Personal history of other diseases of the digestive system: Secondary | ICD-10-CM

## 2018-10-28 DIAGNOSIS — Z20828 Contact with and (suspected) exposure to other viral communicable diseases: Secondary | ICD-10-CM | POA: Diagnosis present

## 2018-10-28 DIAGNOSIS — F329 Major depressive disorder, single episode, unspecified: Secondary | ICD-10-CM | POA: Diagnosis present

## 2018-10-28 DIAGNOSIS — H269 Unspecified cataract: Secondary | ICD-10-CM | POA: Diagnosis present

## 2018-10-28 DIAGNOSIS — Z8673 Personal history of transient ischemic attack (TIA), and cerebral infarction without residual deficits: Secondary | ICD-10-CM | POA: Diagnosis not present

## 2018-10-28 DIAGNOSIS — I350 Nonrheumatic aortic (valve) stenosis: Secondary | ICD-10-CM | POA: Diagnosis present

## 2018-10-28 DIAGNOSIS — Z833 Family history of diabetes mellitus: Secondary | ICD-10-CM | POA: Diagnosis not present

## 2018-10-28 DIAGNOSIS — Z886 Allergy status to analgesic agent status: Secondary | ICD-10-CM

## 2018-10-28 DIAGNOSIS — Z7901 Long term (current) use of anticoagulants: Secondary | ICD-10-CM

## 2018-10-28 DIAGNOSIS — M4316 Spondylolisthesis, lumbar region: Secondary | ICD-10-CM | POA: Diagnosis present

## 2018-10-28 DIAGNOSIS — Z8349 Family history of other endocrine, nutritional and metabolic diseases: Secondary | ICD-10-CM | POA: Diagnosis not present

## 2018-10-28 DIAGNOSIS — Z7984 Long term (current) use of oral hypoglycemic drugs: Secondary | ICD-10-CM

## 2018-10-28 DIAGNOSIS — Z8249 Family history of ischemic heart disease and other diseases of the circulatory system: Secondary | ICD-10-CM

## 2018-10-28 DIAGNOSIS — I4891 Unspecified atrial fibrillation: Secondary | ICD-10-CM | POA: Diagnosis present

## 2018-10-28 DIAGNOSIS — M48062 Spinal stenosis, lumbar region with neurogenic claudication: Secondary | ICD-10-CM | POA: Diagnosis present

## 2018-10-28 DIAGNOSIS — M810 Age-related osteoporosis without current pathological fracture: Secondary | ICD-10-CM | POA: Diagnosis present

## 2018-10-28 DIAGNOSIS — Z79899 Other long term (current) drug therapy: Secondary | ICD-10-CM

## 2018-10-28 HISTORY — PX: ANTERIOR LUMBAR FUSION: SHX1170

## 2018-10-28 LAB — ABO/RH: ABO/RH(D): O POS

## 2018-10-28 LAB — GLUCOSE, CAPILLARY
Glucose-Capillary: 109 mg/dL — ABNORMAL HIGH (ref 70–99)
Glucose-Capillary: 132 mg/dL — ABNORMAL HIGH (ref 70–99)
Glucose-Capillary: 171 mg/dL — ABNORMAL HIGH (ref 70–99)

## 2018-10-28 SURGERY — ANTERIOR LUMBAR FUSION 1 LEVEL
Anesthesia: General | Site: Back

## 2018-10-28 MED ORDER — GLYCOPYRROLATE 0.2 MG/ML IJ SOLN
INTRAMUSCULAR | Status: AC
  Filled 2018-10-28: qty 1

## 2018-10-28 MED ORDER — GLIPIZIDE 5 MG PO TABS
2.5000 mg | ORAL_TABLET | Freq: Every day | ORAL | Status: DC
  Administered 2018-10-29: 2.5 mg via ORAL
  Filled 2018-10-28 (×2): qty 0.5

## 2018-10-28 MED ORDER — VENLAFAXINE HCL ER 150 MG PO CP24
150.0000 mg | ORAL_CAPSULE | Freq: Every day | ORAL | Status: DC
  Filled 2018-10-28 (×2): qty 1

## 2018-10-28 MED ORDER — AMLODIPINE BESYLATE 10 MG PO TABS
10.0000 mg | ORAL_TABLET | Freq: Every day | ORAL | Status: DC
  Administered 2018-10-28: 10 mg via ORAL
  Filled 2018-10-28: qty 1

## 2018-10-28 MED ORDER — SODIUM CHLORIDE 0.9 % IV SOLN
INTRAVENOUS | Status: DC | PRN
  Administered 2018-10-28: 15:00:00 40 mL

## 2018-10-28 MED ORDER — SODIUM CHLORIDE 0.9 % IV SOLN
INTRAVENOUS | Status: DC | PRN
  Administered 2018-10-28: 25 ug/min via INTRAVENOUS

## 2018-10-28 MED ORDER — BUPIVACAINE HCL (PF) 0.5 % IJ SOLN
INTRAMUSCULAR | Status: DC | PRN
  Administered 2018-10-28: 20 mL

## 2018-10-28 MED ORDER — PROPOFOL 500 MG/50ML IV EMUL
INTRAVENOUS | Status: DC | PRN
  Administered 2018-10-28: 100 ug/kg/min via INTRAVENOUS

## 2018-10-28 MED ORDER — OXYCODONE HCL 5 MG PO TABS
5.0000 mg | ORAL_TABLET | Freq: Once | ORAL | Status: AC | PRN
  Administered 2018-10-28: 19:00:00 5 mg via ORAL

## 2018-10-28 MED ORDER — DEXAMETHASONE SODIUM PHOSPHATE 10 MG/ML IJ SOLN
INTRAMUSCULAR | Status: DC | PRN
  Administered 2018-10-28: 10 mg via INTRAVENOUS

## 2018-10-28 MED ORDER — FAMOTIDINE 20 MG PO TABS
ORAL_TABLET | ORAL | Status: AC
  Administered 2018-10-28: 20 mg via ORAL
  Filled 2018-10-28: qty 1

## 2018-10-28 MED ORDER — METFORMIN HCL 500 MG PO TABS
1000.0000 mg | ORAL_TABLET | Freq: Two times a day (BID) | ORAL | Status: DC
  Administered 2018-10-29 (×2): 1000 mg via ORAL
  Filled 2018-10-28 (×2): qty 2

## 2018-10-28 MED ORDER — LIDOCAINE HCL (CARDIAC) PF 100 MG/5ML IV SOSY
PREFILLED_SYRINGE | INTRAVENOUS | Status: DC | PRN
  Administered 2018-10-28: 50 mg via INTRAVENOUS

## 2018-10-28 MED ORDER — HYDROMORPHONE HCL 1 MG/ML IJ SOLN: 0.5000 mg | INTRAMUSCULAR | Status: DC | PRN

## 2018-10-28 MED ORDER — REMIFENTANIL HCL 1 MG IV SOLR
INTRAVENOUS | Status: DC | PRN
  Administered 2018-10-28: .1 ug/kg/min via INTRAVENOUS

## 2018-10-28 MED ORDER — SUGAMMADEX SODIUM 200 MG/2ML IV SOLN
INTRAVENOUS | Status: AC
  Filled 2018-10-28: qty 2

## 2018-10-28 MED ORDER — PROMETHAZINE HCL 25 MG/ML IJ SOLN: 6.2500 mg | INTRAMUSCULAR | Status: DC | PRN

## 2018-10-28 MED ORDER — ATORVASTATIN CALCIUM 20 MG PO TABS
80.0000 mg | ORAL_TABLET | Freq: Every day | ORAL | Status: DC
  Administered 2018-10-29: 80 mg via ORAL
  Filled 2018-10-28: qty 4

## 2018-10-28 MED ORDER — SODIUM CHLORIDE (PF) 0.9 % IJ SOLN
INTRAMUSCULAR | Status: AC
  Filled 2018-10-28: qty 20

## 2018-10-28 MED ORDER — ACETAMINOPHEN 10 MG/ML IV SOLN
INTRAVENOUS | Status: AC
  Filled 2018-10-28: qty 100

## 2018-10-28 MED ORDER — ONDANSETRON HCL 4 MG/2ML IJ SOLN
INTRAMUSCULAR | Status: AC
  Filled 2018-10-28: qty 2

## 2018-10-28 MED ORDER — FENTANYL CITRATE (PF) 100 MCG/2ML IJ SOLN
INTRAMUSCULAR | Status: AC
  Filled 2018-10-28: qty 2

## 2018-10-28 MED ORDER — ACETAMINOPHEN 500 MG PO TABS
1000.0000 mg | ORAL_TABLET | Freq: Four times a day (QID) | ORAL | Status: AC
  Administered 2018-10-28 – 2018-10-29 (×4): 1000 mg via ORAL
  Filled 2018-10-28 (×4): qty 2

## 2018-10-28 MED ORDER — ROCURONIUM BROMIDE 50 MG/5ML IV SOLN
INTRAVENOUS | Status: AC
  Filled 2018-10-28: qty 1

## 2018-10-28 MED ORDER — BUPIVACAINE HCL (PF) 0.5 % IJ SOLN
INTRAMUSCULAR | Status: AC
  Filled 2018-10-28: qty 30

## 2018-10-28 MED ORDER — PHENOL 1.4 % MT LIQD
1.0000 | OROMUCOSAL | Status: DC | PRN
  Filled 2018-10-28: qty 177

## 2018-10-28 MED ORDER — THROMBIN 5000 UNITS EX SOLR
CUTANEOUS | Status: AC
  Filled 2018-10-28: qty 5000

## 2018-10-28 MED ORDER — PROPOFOL 10 MG/ML IV BOLUS
INTRAVENOUS | Status: AC
  Filled 2018-10-28: qty 20

## 2018-10-28 MED ORDER — ACETAMINOPHEN 10 MG/ML IV SOLN
INTRAVENOUS | Status: DC | PRN
  Administered 2018-10-28: 1000 mg via INTRAVENOUS

## 2018-10-28 MED ORDER — LISINOPRIL 20 MG PO TABS: 20.0000 mg | ORAL_TABLET | Freq: Every day | ORAL | Status: DC

## 2018-10-28 MED ORDER — POLYETHYLENE GLYCOL 3350 17 G PO PACK: 17.0000 g | PACK | Freq: Every day | ORAL | Status: DC | PRN

## 2018-10-28 MED ORDER — DIAZEPAM 5 MG PO TABS
5.0000 mg | ORAL_TABLET | Freq: Once | ORAL | Status: AC
  Administered 2018-10-28: 5 mg via ORAL
  Filled 2018-10-28: qty 1

## 2018-10-28 MED ORDER — SODIUM CHLORIDE 0.9% FLUSH: 3.0000 mL | INTRAVENOUS | Status: DC | PRN

## 2018-10-28 MED ORDER — SODIUM CHLORIDE 0.9 % IV SOLN: 250.0000 mL | INTRAVENOUS | Status: DC

## 2018-10-28 MED ORDER — SUCCINYLCHOLINE CHLORIDE 20 MG/ML IJ SOLN
INTRAMUSCULAR | Status: AC
  Filled 2018-10-28: qty 1

## 2018-10-28 MED ORDER — MENTHOL 3 MG MT LOZG
1.0000 | LOZENGE | OROMUCOSAL | Status: DC | PRN
  Filled 2018-10-28: qty 9

## 2018-10-28 MED ORDER — BISACODYL 5 MG PO TBEC: 5.0000 mg | DELAYED_RELEASE_TABLET | Freq: Every day | ORAL | Status: DC | PRN

## 2018-10-28 MED ORDER — MEPERIDINE HCL 50 MG/ML IJ SOLN: 6.2500 mg | INTRAMUSCULAR | Status: DC | PRN

## 2018-10-28 MED ORDER — METHOCARBAMOL 750 MG PO TABS
750.0000 mg | ORAL_TABLET | Freq: Four times a day (QID) | ORAL | Status: DC
  Administered 2018-10-29 (×3): 750 mg via ORAL
  Filled 2018-10-28 (×6): qty 1

## 2018-10-28 MED ORDER — ONDANSETRON HCL 4 MG PO TABS: 4.0000 mg | ORAL_TABLET | Freq: Four times a day (QID) | ORAL | Status: DC | PRN

## 2018-10-28 MED ORDER — PROPOFOL 500 MG/50ML IV EMUL
INTRAVENOUS | Status: AC
  Filled 2018-10-28: qty 50

## 2018-10-28 MED ORDER — ONDANSETRON HCL 4 MG/2ML IJ SOLN: 4.0000 mg | Freq: Four times a day (QID) | INTRAMUSCULAR | Status: DC | PRN

## 2018-10-28 MED ORDER — DEXAMETHASONE SODIUM PHOSPHATE 10 MG/ML IJ SOLN
INTRAMUSCULAR | Status: AC
  Filled 2018-10-28: qty 1

## 2018-10-28 MED ORDER — REMIFENTANIL HCL 1 MG IV SOLR
INTRAVENOUS | Status: AC
  Filled 2018-10-28: qty 1000

## 2018-10-28 MED ORDER — ROCURONIUM BROMIDE 100 MG/10ML IV SOLN
INTRAVENOUS | Status: DC | PRN
  Administered 2018-10-28: 5 mg via INTRAVENOUS
  Administered 2018-10-28: 25 mg via INTRAVENOUS

## 2018-10-28 MED ORDER — EPHEDRINE SULFATE 50 MG/ML IJ SOLN
INTRAMUSCULAR | Status: DC | PRN
  Administered 2018-10-28 (×2): 5 mg via INTRAVENOUS

## 2018-10-28 MED ORDER — HYDROCHLOROTHIAZIDE 25 MG PO TABS
25.0000 mg | ORAL_TABLET | Freq: Every day | ORAL | Status: DC
  Administered 2018-10-29: 25 mg via ORAL
  Filled 2018-10-28: qty 1

## 2018-10-28 MED ORDER — SODIUM CHLORIDE 0.9 % IV SOLN
INTRAVENOUS | Status: DC
  Administered 2018-10-28: 23:00:00 via INTRAVENOUS

## 2018-10-28 MED ORDER — FAMOTIDINE 20 MG PO TABS
20.0000 mg | ORAL_TABLET | Freq: Once | ORAL | Status: AC
  Administered 2018-10-28: 13:00:00 20 mg via ORAL

## 2018-10-28 MED ORDER — SENNA 8.6 MG PO TABS
1.0000 | ORAL_TABLET | Freq: Two times a day (BID) | ORAL | Status: DC
  Administered 2018-10-28 – 2018-10-29 (×2): 8.6 mg via ORAL
  Filled 2018-10-28 (×2): qty 1

## 2018-10-28 MED ORDER — METHOCARBAMOL 1000 MG/10ML IJ SOLN
500.0000 mg | Freq: Four times a day (QID) | INTRAVENOUS | Status: DC
  Filled 2018-10-28: qty 5

## 2018-10-28 MED ORDER — SODIUM CHLORIDE 0.9% FLUSH
3.0000 mL | Freq: Two times a day (BID) | INTRAVENOUS | Status: DC
  Administered 2018-10-29: 08:00:00 3 mL via INTRAVENOUS

## 2018-10-28 MED ORDER — CEFAZOLIN SODIUM-DEXTROSE 2-4 GM/100ML-% IV SOLN
INTRAVENOUS | Status: AC
  Filled 2018-10-28: qty 100

## 2018-10-28 MED ORDER — BUPIVACAINE-EPINEPHRINE (PF) 0.5% -1:200000 IJ SOLN
INTRAMUSCULAR | Status: AC
  Filled 2018-10-28: qty 30

## 2018-10-28 MED ORDER — BUPIVACAINE LIPOSOME 1.3 % IJ SUSP
INTRAMUSCULAR | Status: AC
  Filled 2018-10-28: qty 20

## 2018-10-28 MED ORDER — SODIUM CHLORIDE FLUSH 0.9 % IV SOLN
INTRAVENOUS | Status: AC
  Filled 2018-10-28: qty 40

## 2018-10-28 MED ORDER — VANCOMYCIN HCL 1.25 G IV SOLR
1250.0000 mg | INTRAVENOUS | Status: AC
  Administered 2018-10-28: 13:00:00 1250 mg via INTRAVENOUS
  Filled 2018-10-28 (×2): qty 1250

## 2018-10-28 MED ORDER — MIDAZOLAM HCL 2 MG/2ML IJ SOLN
INTRAMUSCULAR | Status: DC | PRN
  Administered 2018-10-28: 2 mg via INTRAVENOUS

## 2018-10-28 MED ORDER — PHENYLEPHRINE HCL (PRESSORS) 10 MG/ML IV SOLN
INTRAVENOUS | Status: AC
  Filled 2018-10-28: qty 1

## 2018-10-28 MED ORDER — LISINOPRIL-HYDROCHLOROTHIAZIDE 20-25 MG PO TABS: 1.0000 | ORAL_TABLET | Freq: Every day | ORAL | Status: DC

## 2018-10-28 MED ORDER — SUCCINYLCHOLINE CHLORIDE 20 MG/ML IJ SOLN
INTRAMUSCULAR | Status: DC | PRN
  Administered 2018-10-28: 100 mg via INTRAVENOUS

## 2018-10-28 MED ORDER — ACETAMINOPHEN 325 MG PO TABS: 650.0000 mg | ORAL_TABLET | ORAL | Status: DC | PRN

## 2018-10-28 MED ORDER — PHENYLEPHRINE HCL (PRESSORS) 10 MG/ML IV SOLN
INTRAVENOUS | Status: DC | PRN
  Administered 2018-10-28: 200 ug via INTRAVENOUS
  Administered 2018-10-28: 100 ug via INTRAVENOUS
  Administered 2018-10-28: 200 ug via INTRAVENOUS

## 2018-10-28 MED ORDER — MAGNESIUM CITRATE PO SOLN
1.0000 | Freq: Once | ORAL | Status: DC | PRN
  Filled 2018-10-28: qty 296

## 2018-10-28 MED ORDER — GLYCOPYRROLATE 0.2 MG/ML IJ SOLN
INTRAMUSCULAR | Status: DC | PRN
  Administered 2018-10-28 (×2): 0.2 mg via INTRAVENOUS

## 2018-10-28 MED ORDER — LACTATED RINGERS IV SOLN
INTRAVENOUS | Status: DC | PRN
  Administered 2018-10-28: 13:00:00 via INTRAVENOUS

## 2018-10-28 MED ORDER — KETAMINE HCL 50 MG/ML IJ SOLN
INTRAMUSCULAR | Status: DC | PRN
  Administered 2018-10-28: 50 mg via INTRAVENOUS

## 2018-10-28 MED ORDER — OXYCODONE HCL 5 MG/5ML PO SOLN: 5.0000 mg | Freq: Once | ORAL | Status: AC | PRN

## 2018-10-28 MED ORDER — FENTANYL CITRATE (PF) 100 MCG/2ML IJ SOLN
INTRAMUSCULAR | Status: DC | PRN
  Administered 2018-10-28 (×2): 50 ug via INTRAVENOUS

## 2018-10-28 MED ORDER — ACETAMINOPHEN 650 MG RE SUPP: 650.0000 mg | RECTAL | Status: DC | PRN

## 2018-10-28 MED ORDER — SODIUM CHLORIDE 0.9 % IV SOLN
INTRAVENOUS | Status: DC
  Administered 2018-10-28: 13:00:00 via INTRAVENOUS

## 2018-10-28 MED ORDER — SODIUM CHLORIDE 0.9 % IR SOLN
Status: DC | PRN
  Administered 2018-10-28: 14:00:00 1000 mL

## 2018-10-28 MED ORDER — CEFAZOLIN SODIUM-DEXTROSE 2-4 GM/100ML-% IV SOLN
2.0000 g | INTRAVENOUS | Status: AC
  Administered 2018-10-28: 14:00:00 2 g via INTRAVENOUS

## 2018-10-28 MED ORDER — INSULIN ASPART 100 UNIT/ML ~~LOC~~ SOLN
0.0000 [IU] | Freq: Three times a day (TID) | SUBCUTANEOUS | Status: DC
  Administered 2018-10-29: 08:00:00 3 [IU] via SUBCUTANEOUS
  Filled 2018-10-28: qty 1

## 2018-10-28 MED ORDER — LISINOPRIL 20 MG PO TABS
20.0000 mg | ORAL_TABLET | Freq: Every day | ORAL | Status: DC
  Administered 2018-10-29: 20 mg via ORAL
  Filled 2018-10-28: qty 1

## 2018-10-28 MED ORDER — OXYCODONE HCL 5 MG PO TABS: 5.0000 mg | ORAL_TABLET | ORAL | Status: DC | PRN

## 2018-10-28 MED ORDER — BACITRACIN 50000 UNITS IM SOLR
INTRAMUSCULAR | Status: AC
  Filled 2018-10-28: qty 1

## 2018-10-28 MED ORDER — LIDOCAINE HCL (PF) 2 % IJ SOLN
INTRAMUSCULAR | Status: AC
  Filled 2018-10-28: qty 10

## 2018-10-28 MED ORDER — GABAPENTIN 400 MG PO CAPS
800.0000 mg | ORAL_CAPSULE | Freq: Two times a day (BID) | ORAL | Status: DC
  Administered 2018-10-28 – 2018-10-29 (×2): 800 mg via ORAL
  Filled 2018-10-28 (×2): qty 2

## 2018-10-28 MED ORDER — ONDANSETRON HCL 4 MG/2ML IJ SOLN
INTRAMUSCULAR | Status: DC | PRN
  Administered 2018-10-28: 4 mg via INTRAVENOUS

## 2018-10-28 MED ORDER — BUPIVACAINE-EPINEPHRINE 0.5% -1:200000 IJ SOLN
INTRAMUSCULAR | Status: DC | PRN
  Administered 2018-10-28: 8 mL

## 2018-10-28 MED ORDER — SUGAMMADEX SODIUM 200 MG/2ML IV SOLN
INTRAVENOUS | Status: DC | PRN
  Administered 2018-10-28: 200 mg via INTRAVENOUS

## 2018-10-28 MED ORDER — OXYCODONE HCL 5 MG PO TABS
10.0000 mg | ORAL_TABLET | ORAL | Status: DC | PRN
  Filled 2018-10-28: qty 2

## 2018-10-28 MED ORDER — MIDAZOLAM HCL 2 MG/2ML IJ SOLN
INTRAMUSCULAR | Status: AC
  Filled 2018-10-28: qty 2

## 2018-10-28 MED ORDER — PROPOFOL 10 MG/ML IV BOLUS
INTRAVENOUS | Status: DC | PRN
  Administered 2018-10-28: 150 mg via INTRAVENOUS

## 2018-10-28 MED ORDER — THROMBIN 5000 UNITS EX SOLR
CUTANEOUS | Status: DC | PRN
  Administered 2018-10-28: 5000 [IU] via TOPICAL

## 2018-10-28 MED ORDER — FENTANYL CITRATE (PF) 100 MCG/2ML IJ SOLN
25.0000 ug | INTRAMUSCULAR | Status: DC | PRN
  Administered 2018-10-28 (×4): 25 ug via INTRAVENOUS

## 2018-10-28 MED ORDER — OXYCODONE HCL 5 MG PO TABS
ORAL_TABLET | ORAL | Status: AC
  Filled 2018-10-28: qty 1

## 2018-10-28 SURGICAL SUPPLY — 88 items
BUR NEURO DRILL SOFT 3.0X3.8M (BURR) ×3 IMPLANT
CAGE MODULUS XL 12X18X55 - 10 (Cage) ×3 IMPLANT
CANISTER SUCT 1200ML W/VALVE (MISCELLANEOUS) ×6 IMPLANT
CHLORAPREP W/TINT 26 (MISCELLANEOUS) ×12 IMPLANT
CORD BIP STRL DISP 12FT (MISCELLANEOUS) ×3 IMPLANT
COUNTER NEEDLE 20/40 LG (NEEDLE) ×3 IMPLANT
COVER BACK TABLE REUSABLE LG (DRAPES) ×3 IMPLANT
COVER LIGHT HANDLE STERIS (MISCELLANEOUS) ×12 IMPLANT
COVER WAND RF STERILE (DRAPES) ×3 IMPLANT
CRADLE LAMINECT ARM (MISCELLANEOUS) ×6 IMPLANT
CUP MEDICINE 2OZ PLAST GRAD ST (MISCELLANEOUS) ×3 IMPLANT
DERMABOND ADVANCED (GAUZE/BANDAGES/DRESSINGS) ×4
DERMABOND ADVANCED .7 DNX12 (GAUZE/BANDAGES/DRESSINGS) ×2 IMPLANT
DRAPE C-ARM 42X72 X-RAY (DRAPES) ×6 IMPLANT
DRAPE C-ARMOR (DRAPES) ×6 IMPLANT
DRAPE INCISE IOBAN 66X45 STRL (DRAPES) ×6 IMPLANT
DRAPE LAPAROTOMY 100X77 ABD (DRAPES) ×6 IMPLANT
DRAPE MICROSCOPE SPINE 48X150 (DRAPES) ×3 IMPLANT
DRAPE POUCH INSTRU U-SHP 10X18 (DRAPES) ×3 IMPLANT
DRAPE SURG 17X11 SM STRL (DRAPES) ×24 IMPLANT
DRSG OPSITE POSTOP 4X6 (GAUZE/BANDAGES/DRESSINGS) IMPLANT
DRSG TEGADERM 2-3/8X2-3/4 SM (GAUZE/BANDAGES/DRESSINGS) IMPLANT
DRSG TEGADERM 4X4.75 (GAUZE/BANDAGES/DRESSINGS) IMPLANT
DRSG TEGADERM 6X8 (GAUZE/BANDAGES/DRESSINGS) IMPLANT
DRSG TELFA 3X8 NADH (GAUZE/BANDAGES/DRESSINGS) IMPLANT
DRSG TELFA 4X3 1S NADH ST (GAUZE/BANDAGES/DRESSINGS) IMPLANT
ELECT CAUTERY BLADE TIP 2.5 (TIP) ×6
ELECT EZSTD 165MM 6.5IN (MISCELLANEOUS) ×3
ELECT REM PT RETURN 9FT ADLT (ELECTROSURGICAL) ×6
ELECTRODE CAUTERY BLDE TIP 2.5 (TIP) ×2 IMPLANT
ELECTRODE EZSTD 165MM 6.5IN (MISCELLANEOUS) ×1 IMPLANT
ELECTRODE REM PT RTRN 9FT ADLT (ELECTROSURGICAL) ×2 IMPLANT
FEE INTRAOP MONITOR IMPULS NCS (MISCELLANEOUS) IMPLANT
FRAME EYE SHIELD (PROTECTIVE WEAR) ×6 IMPLANT
GLOVE BIOGEL PI IND STRL 7.0 (GLOVE) ×2 IMPLANT
GLOVE BIOGEL PI INDICATOR 7.0 (GLOVE) ×4
GLOVE SURG SYN 7.0 (GLOVE) ×12 IMPLANT
GLOVE SURG SYN 8.5  E (GLOVE) ×12
GLOVE SURG SYN 8.5 E (GLOVE) ×6 IMPLANT
GOWN SRG XL LVL 3 NONREINFORCE (GOWNS) ×2 IMPLANT
GOWN STRL NON-REIN TWL XL LVL3 (GOWNS) ×4
GOWN STRL REUS W/TWL MED LVL3 (GOWN DISPOSABLE) ×6 IMPLANT
GRADUATE 1200CC STRL 31836 (MISCELLANEOUS) ×3 IMPLANT
GUIDEWIRE NITINOL BEVEL TIP (WIRE) ×12 IMPLANT
INTRAOP MONITOR FEE IMPULS NCS (MISCELLANEOUS)
INTRAOP MONITOR FEE IMPULSE (MISCELLANEOUS)
KIT DILATOR XLIF 5 (KITS) ×1 IMPLANT
KIT NEEDLE NVM5 EMG ELECT (KITS) ×1 IMPLANT
KIT NEEDLE NVM5 EMG ELECTRODE (KITS) ×2
KIT SPINAL PRONEVIEW (KITS) ×3 IMPLANT
KIT SURGICAL ACCESS MAXCESS 4 (KITS) ×3 IMPLANT
KIT TURNOVER KIT A (KITS) ×3 IMPLANT
KIT XLIF (KITS) ×2
KNIFE BAYONET SHORT DISCETOMY (MISCELLANEOUS) IMPLANT
MARKER SKIN DUAL TIP RULER LAB (MISCELLANEOUS) ×9 IMPLANT
NDL SAFETY ECLIPSE 18X1.5 (NEEDLE) ×1 IMPLANT
NEEDLE HYPO 18GX1.5 SHARP (NEEDLE) ×2
NEEDLE HYPO 22GX1.5 SAFETY (NEEDLE) ×3 IMPLANT
NEEDLE I PASS (NEEDLE) ×3 IMPLANT
PACK LAMINECTOMY NEURO (CUSTOM PROCEDURE TRAY) ×3 IMPLANT
PAD ARMBOARD 7.5X6 YLW CONV (MISCELLANEOUS) ×3 IMPLANT
PENCIL ELECTRO HAND CTR (MISCELLANEOUS) ×3 IMPLANT
PLATE MODULUS XLIF 12 MODULAR (Plate) ×3 IMPLANT
PUTTY DBM PROPEL MEDIUM (Putty) ×2 IMPLANT
PUTTY PROPEL MEDIUM (Putty) ×1 IMPLANT
ROD RELINE MAS TI LORD 5.5X40 (Rod) ×6 IMPLANT
SCREW LOCK RELINE 5.5 TULIP (Screw) ×12 IMPLANT
SCREW RELINE RED 6.5X45MM POLY (Screw) ×12 IMPLANT
SCREW XL 45X5.5XVA NS SPNE (Screw) ×1 IMPLANT
SCREW XL VAR (Screw) ×2 IMPLANT
SPOGE SURGIFLO 8M (HEMOSTASIS) ×2
SPONGE GAUZE 2X2 8PLY STER LF (GAUZE/BANDAGES/DRESSINGS)
SPONGE GAUZE 2X2 8PLY STRL LF (GAUZE/BANDAGES/DRESSINGS) IMPLANT
SPONGE SURGIFLO 8M (HEMOSTASIS) ×1 IMPLANT
STAPLER SKIN PROX 35W (STAPLE) IMPLANT
SUT DVC VLOC 3-0 CL 6 P-12 (SUTURE) ×3 IMPLANT
SUT ETHILON 3-0 FS-10 30 BLK (SUTURE)
SUT VIC AB 0 CT1 27 (SUTURE) ×2
SUT VIC AB 0 CT1 27XCR 8 STRN (SUTURE) ×1 IMPLANT
SUT VIC AB 2-0 CT1 18 (SUTURE) ×3 IMPLANT
SUTURE EHLN 3-0 FS-10 30 BLK (SUTURE) IMPLANT
SYR 30ML LL (SYRINGE) ×6 IMPLANT
TOWEL OR 17X26 4PK STRL BLUE (TOWEL DISPOSABLE) ×9 IMPLANT
TRAY FOLEY MTR SLVR 16FR STAT (SET/KITS/TRAYS/PACK) IMPLANT
TUBE MATRX SPINL 18MM 6CM DISP (INSTRUMENTS) ×2
TUBE METRX SPINAL 18X6 DISP (INSTRUMENTS) ×1 IMPLANT
TUBING CONNECTING 10 (TUBING) ×6 IMPLANT
TUBING CONNECTING 10' (TUBING) ×3

## 2018-10-28 NOTE — Anesthesia Post-op Follow-up Note (Signed)
Anesthesia QCDR form completed.        

## 2018-10-28 NOTE — Progress Notes (Signed)
Pharmacy Consult for Cefazolin and Vancomycin  73 yo F  Wt 84 kg  Will order Cefazolin 2 gm IV preop x1. Will order Vancomycin 1250 mg IV preop x1  (15 mg/kg)  Chinita Greenland PharmD Clinical Pharmacist 10/28/2018

## 2018-10-28 NOTE — Anesthesia Postprocedure Evaluation (Signed)
Anesthesia Post Note  Patient: Sailee Crumb Schnider  Procedure(s) Performed: L4-5 XLIF, L4-5 PSFD (POSTERIOR FUSION WITH DECOMPRESSION) (N/A Back)  Patient location during evaluation: PACU Anesthesia Type: General Level of consciousness: awake and alert Pain management: pain level controlled Vital Signs Assessment: post-procedure vital signs reviewed and stable Respiratory status: spontaneous breathing, nonlabored ventilation, respiratory function stable and patient connected to nasal cannula oxygen Cardiovascular status: blood pressure returned to baseline and stable Postop Assessment: no apparent nausea or vomiting Anesthetic complications: no     Last Vitals:  Vitals:   10/28/18 2033 10/28/18 2140  BP: (!) 110/59 116/76  Pulse: 65 73  Resp: 16 16  Temp: (!) 36.3 C (!) 36.4 C  SpO2: 98% 100%    Last Pain:  Vitals:   10/28/18 2140  TempSrc: Oral  PainSc:                  Martha Clan

## 2018-10-28 NOTE — Transfer of Care (Signed)
Immediate Anesthesia Transfer of Care Note  Patient: Autumn Herman  Procedure(s) Performed: L4-5 XLIF, L4-5 PSFD (POSTERIOR FUSION WITH DECOMPRESSION) (N/A Back)  Patient Location: PACU  Anesthesia Type:General  Level of Consciousness: drowsy  Airway & Oxygen Therapy: Patient Spontanous Breathing and Patient connected to face mask oxygen  Post-op Assessment: Report given to RN, Post -op Vital signs reviewed and stable and Patient moving all extremities X 4  Post vital signs: Reviewed and stable  Last Vitals:  Vitals Value Taken Time  BP 128/86 10/28/18 1715  Temp    Pulse 91 10/28/18 1719  Resp 23 10/28/18 1719  SpO2 100 % 10/28/18 1719  Vitals shown include unvalidated device data.  Last Pain:  Vitals:   10/28/18 1204  TempSrc: Oral  PainSc: 0-No pain         Complications: No apparent anesthesia complications

## 2018-10-28 NOTE — Op Note (Signed)
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  She failed conservative management and elected for surgical intervention.  Findings: improvement of foraminal stenosis  Preoperative Diagnosis: M43.16, M48.062 Postoperative Diagnosis: same   EBL: 50 ml IVF: 1100 ml Drains: none Disposition: Extubated and Stable to PACU Complications: none  A foley catheter was placed.   Preoperative Note:   Risks of surgery discussed include: infection, bleeding, stroke, coma, death, paralysis, CSF leak, nerve/spinal cord injury, numbness, tingling, weakness, complex regional pain syndrome, recurrent stenosis and/or disc herniation, vascular injury, development of instability, neck/back pain, need for further surgery, persistent symptoms, development of deformity, and the risks of anesthesia. The patient understood these risks and agreed to proceed.  NAME OF ANTERIOR PROCEDURE:               1. Anterior lumbar interbody fusion via a left lateral retroperitoneal approach at L4/5 2. Placement of a Lordotic Modulus  [id number] interbody cage, filled with Demineralized Bone Matrix 3. Anterior instrumentation using Nuvasive Lateral Instrumentation  NAME OF POSTERIOR PROCEDURE: 1. Posterior instrumentation using   Nuvasive Reline Instrumentation 2. Posterolateral fusion, L4-5  3. Posterior decompression, L4-5    PROCEDURE:  Patient was brought to the operating room, intubated, turned to the lateral position.  All pressure points were checked and double-checked.  The patient was prepped and draped in the standard fashion. Prior to prepping, fluoroscopy was brought in and the patient was positioned with a large bump under the contralateral side between the iliac crest and rib cage, allowing the area between the iliac crest and the lateral aspect of the rib cage to open and increase the ability to reach  inferiorly, to facilitate entry into the disc space.  The incision was marked upon the skin both the location of the disc space as well as the superior most aspect of the iliac crest.  Based on the identification of the disc space an incision was prepared, marked upon the skin and eventually was used for our lateral incision.  The fluoroscopy was turned into a cross table A/P image in order to confirm that the patient's spine remained in a perpendicular trajectory to the floor without rotation.  Once confirming that all the pressure points were checked and double-checked and the patient remained in sturdy position strapped down in this slightly jack-knifed lateral position, the patient was prepped and draped in standard fashion.  The skin was injected with local anesthetic, then incised until the abdominal wall fascia was noted.  I bluntly dissected posteriorly until we were able to identify the posterior musculature near petit's triangle.  At this point, using primarily blunt dissection with our finger aided with a metzenbaum scissor, were able to enter the retroperitoneal cavity.  The retroperitoneal potential space was opened further until palpating out the psoas muscle, the medial aspect of the iliac crest, the medial aspect of the last rib and continued to define the retroperitoneal space with blunt dissection in order to facilitate safe placement of our dilators.    While protecting by dissecting directly onto a finger in the retroperitoneum, the retroperitoneal space was entered safely from the lateral incision and the initial dilator placed onto the muscle belly of the psoas.  While directly stimulating the dilator and after radiographically confirming our location relative to the disc space, I placed the dilator through the psoas.  The dilators were stimulated to ensure remaining safely away  from any of the lumbar plexus nerves; the dilators were repositioned until no pathologic stimulation was  appreciated.  Once I had confirmed the location of our initial dilator radiographically, a K-wire secured the dilator into the L4/5 disc space and confirmed position under A/P and lateral fluoroscopy.  At this point, I dilated up with direct stimulation to confirm lack of pathologic stimulation.  Once all the dilators were in position, I placed in the retractor and secured it onto the table, locked into position and confirmed under A/P and lateral fluoroscopy to confirm our approach angle to the disc space as well as location relative to the disc space.  I then placed the muscle stimulator in through the working channel down to the vertebral body, stimulating the entire lateral surface of the vertebral body and any of the visualized psoas muscle that was adjacent to the retractor, confirming again the safe passage to the psoas before we began performing the discectomy.  At this point, we began our discectomy at L4/5.  The disc was incised laterally throughout the extent of our exposure. Using a combination of pituitary rongeurs, Kerrison rongeurs, rasps, curettes of various sorts, we were able to begin to clean out the disc space.  Once we had cleaned out the majority of the disc space, we then cut the lateral annulus with a cob, breaking the lateral annual attachments on the contralateral side by subtly working the cob through the annulus while using flouroscopy.  Care was taken not to extend further than required after cutting the annular attachments.  After this had been performed, we prepared the endplates for placement of our graft, sized a graft to the disc space by serially dilating up in trial sizes until we confirmed that our graft would be well positioned, allowing distraction while maintaining good grip.  This was confirmed under A/P and lateral fluoroscopy in order to ensure its placement as an eventual trial for placement of our final graft.  We irrigated with bacteriostatic saline.  Once confirmed  placement, the Modulus implant filled with allograft was impacted into position at L4/5.   Through a combination of intradiscal distraction and anterior releasing, we were able to correct the anterior deformity during disc preparation and placement of the graft.   At this point we placed in lateral instrumentation.  Through the same working channel, a 12 mm Nuvasive lateral plate was secured to the L4 vertebral body, having previously been secured to the interbody device.  Using an awl, the tract was cannulated, then a 5.5x45 mm screw was placed into L4.   At this point, final radiographs were performed, and we began closure.  The wound was closed using 0 Vicryl interrupted suture in the fascia and 2-0 Vicryl inverted suture were placed in the subcutaneous tissue and dermis. 3-0 monocryl was used for final closure. Dermabond was used to close the skin.    After closing the anterior part in layers, the patient was repositioned into prone position.  All pressure points were checked and double-checked and we brought in fluoroscopy to confirm our approach angles for putting in percutaneous pedicle screws.  The pedicles were marked using true AP flouroscopy, adjusting the angle at each level.  We then prepped and draped the patient in the standard fashion.  At this point, incisions were made for placing percutaneous pedicle screw instrumentation at L4-5.  Starting at L4, a Jamsheedi needle was used to cannulate the pedicle bilaterally using AP flouroscopy. Direct stimulation was used on the needle  without any low (<15 mAmp) stimulation thresholds. After cannulation of the pedicle to 30 mm, a K-wire was placed through the Brinsmade approximately and secured.   Using a similar technique, the pedicles at L4-L5 were cannulated and K wires secured. The K wires were then checked using lateral flouroscopy to ensure placement into the vertebral bodies. After confirming placement of K wires, cannulated pedicle screws  were introduced over the K wires at each level.  After advancing each screw into the vertebral body approximately 25-30 mm, the K wire was removed.At each level, 6.5x30mm Nuvasive Reline pedicle screws were placed under lateral flouroscopy. Each screw was stimulated with stimulation threshold >72mAmp. Once the screws were placed, the screw extensions were then linked, a path was formed for the rod and a rod was utilized to connect the screws.  We then compressed, torqued / counter-torqued and removed the screw assembly. Once performed on each side, confirmatory AP and lateral x-rays were taken and the case was completed.   After performing the XLIF, the metrx tubes were sequentially advanced and confirmed in position at L4-5. An 29mm by 59mm tube was locked in place to the bed side attachment.  Fluoroscopy was then removed from the field.  The microscope was then sterilely brought into the field and muscle creep was hemostased with a bipolar and resected with a pituitary rongeur.  A Bovie extender was then used to expose the spinous process and lamina.  Careful attention was placed to not violate the facet capsule. A 3 mm matchstick drill bit was then used to make a hemi-laminotomy trough until the ligamentum flavum was exposed.  This was extended to the base of the spinous process and to the contralateral side to remove all the central bone from each side.  Once this was complete and the underlying ligamentum flavum was visualized, it was dissected with a curette and resected with Kerrison rongeurs.  Extensive ligamentum hypertrophy was noted, requiring a substantial amount of time and care for removal.  The dura was identified and palpated. The kerrison rongeur was then used to remove the medial facet bilaterally until no compression was noted.  A balltip probe was used to confirm decompression of the ipsilateral L5 nerve root.  Additional attention was paid to completion of the contralateral foraminotomy  until the contralateral L5 nerve root was completely free.  Once this was complete, L4-5 central decompression including medial facetectomy and foraminotomy was confirmed and decompression on both sides was confirmed. No CSF leak was noted.  The wound was copiously irrigated. The tube system was then removed under microscopic visualization and hemostasis was obtained with a bipolar.    Again we confirmed radiographically and began our closure.  The wound was closed using 0 Vicryl interrupted suture in the fascia, 2-0 Vicryl inverted suture were placed in the subcutaneous tissue and dermis. 3-0 monocryl was used for final closure. Dermabond was used to close the skin.    Needle, lap and all counts were correct at the end of the case.      Marin Olp PA assisted in the entire procedure.  Meade Maw MD Neurosurgery

## 2018-10-28 NOTE — Anesthesia Procedure Notes (Signed)
Procedure Name: Intubation Performed by: Clerance Umland, CRNA Pre-anesthesia Checklist: Patient identified, Patient being monitored, Timeout performed, Emergency Drugs available and Suction available Patient Re-evaluated:Patient Re-evaluated prior to induction Oxygen Delivery Method: Circle system utilized Preoxygenation: Pre-oxygenation with 100% oxygen Induction Type: IV induction Ventilation: Mask ventilation without difficulty Laryngoscope Size: 3 and McGraph Grade View: Grade I Tube type: Oral Tube size: 7.0 mm Number of attempts: 1 Airway Equipment and Method: Stylet and Video-laryngoscopy Placement Confirmation: ETT inserted through vocal cords under direct vision,  positive ETCO2 and breath sounds checked- equal and bilateral Secured at: 21 cm Tube secured with: Tape Dental Injury: Teeth and Oropharynx as per pre-operative assessment        

## 2018-10-28 NOTE — H&P (Signed)
History of Present Illness: 10/28/2018 Autumn Herman presents today with continued symptoms and would like to move forward with surgery.  09/17/2018  Since her last visit, Autumn Herman has tried physical therapy and had an injection. Though she had some improvement at first, she has had consistent episodes of severe symptoms that have required her to miss work multiple times over the past month. She has failed conservative management and has symptoms to the degree that she would like to consider surgical intervention.  07/30/2018 Autumn Herman is here today with a chief complaint of low back pain right buttock pain, right posterior thigh pain, right anterior/lateral lower leg pain, occasional burning in the top of her right foot.  She has been having pain off and on for the past 5 to 6 years with worsening pain over the past year. This goes down the L5 distribution on the right. She does not have any right L4 symptoms. She describes pain that is 0 when she rests but can be as bad as 10 out of 10. Her daughter has noticed a change in her posture and stiffness in her back. She works at [employer] and stands for long periods of time, which substantially aggravates her condition. She is now unable to walk more than 15 feet without pain.  Weakness: none Timing: not specific Bowel/Bladder Dysfunction: none  Conservative measures:  Physical therapy: has not tried formal physical therapy. Her PCP gave her exercises 2-3 years ago. Multimodal medical therapy including regular antiinflammatories: gabapentin, tylenol Injections: has not tried epidural steroid injections  Past Surgery: denies  Autumn Herman has no symptoms of cervical myelopathy.  The symptoms are causing a significant impact on the patient's life.   Review of Systems:  A 10 point review of systems is negative, except for the pertinent positives and negatives detailed in the HPI.  Past Medical History: Past Medical History:  Diagnosis  Date  . Anemia  . Aortic stenosis  . Atrial fibrillation (CMS-HCC)  . Cataracts, bilateral  . Depression  . Diabetes mellitus type 2, uncomplicated (CMS-HCC)  . Goiter  . History of colonic polyps  . Hyperlipidemia  . Hypertension  . Osteoporosis  . Sleep apnea  has CPAP  . Stroke (CMS-HCC)   Past Surgical History: Past Surgical History:  Procedure Laterality Date  . COLONOSCOPY 05/14/2005,08/16/1997  Dr. Ivor Messier @ [facility], FHPolyps(m), rpt 5 yrs per provider, 3 ltrs mailed  . COLONOSCOPY 03/10/2017  Tubular adenoma of the colon/Repeat 46yrs/MUS  . Left shoulder rotator cuff surgery  . thyroid nodule removal  . Thyroid nodule resection   Allergies:  Allergies  Allergen Reactions  . Naproxen   . Prozac [Fluoxetine Hcl]      Medications:  Current Meds  Medication Sig  . amLODipine (NORVASC) 5 MG tablet Take 10 mg by mouth at bedtime.   Marland Kitchen apixaban (ELIQUIS) 5 MG TABS tablet Take 5 mg by mouth 2 (two) times daily.  Marland Kitchen atorvastatin (LIPITOR) 80 MG tablet Take 80 mg by mouth daily.  . Cholecalciferol (VITAMIN D-3 PO) Take 2,000 Units by mouth daily.  . Ferrous Sulfate (IRON) 142 (45 Fe) MG TBCR Take 1 tablet by mouth daily.  Marland Kitchen gabapentin (NEURONTIN) 300 MG capsule Take 300 mg by mouth 3 (three) times daily.  Marland Kitchen gabapentin (NEURONTIN) 800 MG tablet Take 800 mg by mouth 2 (two) times daily.  Marland Kitchen glipiZIDE (GLUCOTROL) 5 MG tablet Take 2.5 mg by mouth daily before breakfast.   . lisinopril-hydrochlorothiazide (ZESTORETIC) 20-25 MG  tablet Take 1 tablet by mouth daily.  . metFORMIN (GLUCOPHAGE) 1000 MG tablet Take 1,000 mg by mouth 2 (two) times daily with a meal.  . Omega-3 Fatty Acids (FISH OIL) 1000 MG CAPS Take 2 capsules by mouth daily. With vitamin E  . venlafaxine XR (EFFEXOR XR) 150 MG 24 hr capsule Take 150 mg by mouth at bedtime.    Social History: Social History   Tobacco Use  . Smoking status: Never Smoker  . Smokeless tobacco: Never Used  Substance Use  Topics  . Alcohol use: No  Alcohol/week: 0.0 standard drinks  . Drug use: No   Family Medical History: Family History  Problem Relation Age of Onset  . Thyroid disease Mother  . Coronary Artery Disease (Blocked arteries around heart) Father  . Angina Father  . Lung cancer Father  . Diabetes type II Paternal Aunt  . High blood pressure (Hypertension) Paternal Grandfather  . Coronary Artery Disease (Blocked arteries around heart) Paternal Grandfather   Physical Examination:  Vitals:   10/28/18 1204  BP: (!) 151/99  Pulse: 94  Resp: 16  Temp: 98 F (36.7 C)  SpO2: 99%    Heart sounds normal no MRG. Chest Clear to Auscultation Bilaterally.   General: Patient is well developed, well nourished, calm, collected, and in no apparent distress. Attention to examination is appropriate.  Psychiatric: Patient is non-anxious.  Head: Pupils equal, round, and reactive to light.  ENT: Oral mucosa appears well hydrated.  Neck: Supple. Full range of motion.  Respiratory: Patient is breathing without any difficulty.  Extremities: No edema.  Vascular: Palpable dorsal pedal pulses.  Skin: On exposed skin, there are no abnormal skin lesions.  NEUROLOGICAL:   Awake, alert, oriented to person, place, and time. Speech is clear and fluent. Fund of knowledge is appropriate.   Cranial Nerves: Pupils equal round and reactive to light. Facial tone is symmetric. Facial sensation is symmetric. Shoulder shrug is symmetric. Tongue protrusion is midline. There is no pronator drift.  ROM of spine: diminished extension. Palpation of spine: non tender.   Strength: Side Biceps Triceps Deltoid Interossei Grip Wrist Ext. Wrist Flex.  R 5 5 5 5 5 5 5   L 5 5 5 5 5 5 5    Side Iliopsoas Quads Hamstring PF DF EHL  R 5 5 5 5 5 5   L 5 5 5 5 5 5    Reflexes are 1+ and symmetric at the biceps, triceps, brachioradialis, patella and achilles. Hoffman's is absent. Clonus is not present. Toes are  down-going.  Bilateral upper and lower extremity sensation is intact to light touch.  Gait is antalgic. No difficulty with tandem gait.  No evidence of dysmetria noted.  Medical Decision Making  Imaging: MRI L spine 03/27/2018 IMPRESSION: L4-5: Degenerative spondylolisthesis of 1 cm. Severe stenosis of the spinal canal it could cause neural compression on either or both sides. Foraminal stenosis worse on the left because of asymmetric foraminal disc material on the left. Left L4 nerve could also be compressed.  L3-4: Disc bulge. Mild facet hypertrophy. No compressive stenosis.  L5-S1: Minimal disc bulge. Mild facet degeneration. No stenosis.  Electronically Signed By: Nelson Chimes M.D. On: 03/27/2018 11:21  I have personally reviewed the images and agree with the above interpretation.  Assessment and Plan: Autumn Herman is a pleasant 73 y.o. female with grade 2 L4 on L5 spondylolisthesis. She has severe stenosis causing neurogenic claudication at L4-5.   I have recommended L4-5 lateral lumbar interbody fusion with  posterior fixation and minimal invasive decompression at that level.    This note was partially dictated using voice recognition software, so please excuse any errors that were not corrected.  Meade Maw MD, [facility] Department of Neurosurgery

## 2018-10-28 NOTE — Progress Notes (Signed)
Procedure: L4-5 XLIF with decompression Procedure date: 10/28/2018 Diagnosis: Neurogenic claudication  History: Autumn Herman is s/p L4-5 XLIF with decompression for neurogenic claudication  POD0: Tolerated procedure well without complication.  Evaluated in postop recovery still disoriented from anesthesia.  Denies any pain at this time.  Physical Exam: Vitals:   10/28/18 1204  BP: (!) 151/99  Pulse: 94  Resp: 16  Temp: 98 F (36.7 C)  SpO2: 99%     Strength:5/5 throughout unable to fully assess at this time but able to move all extremities independently Sensation: Unable to accurately assess at this time Skin: Lateral incision site: Dressing clean and dry Lumbar incisions: Dressing clean and dry Abdomen: Noted distention.  Nontender to palpation. Data:  Recent Labs  Lab 10/23/18 1110  NA 135  K 3.5  CL 94*  CO2 30  BUN 17  CREATININE 1.08*  GLUCOSE 127*  CALCIUM 9.4   No results for input(s): AST, ALT, ALKPHOS in the last 168 hours.  Invalid input(s): TBILI   Recent Labs  Lab 10/23/18 1110  WBC 6.7  HGB 11.5*  HCT 35.1*  PLT 360   Recent Labs  Lab 10/23/18 1110  APTT 31  INR 1.0         Other tests/results: Lumbar x-rays pending  Assessment/Plan:  Autumn Herman POD 0 status post L4-L5 XLIF for neurogenic claudication.  - X-rays - mobilize - pain control - DVT prophylaxis - PTOT -Catheter care (remove POD1)  Marin Olp PA-C Department of Neurosurgery

## 2018-10-28 NOTE — Anesthesia Preprocedure Evaluation (Signed)
Anesthesia Evaluation  Patient identified by MRN, date of birth, ID band Patient awake    Reviewed: Allergy & Precautions, NPO status , Patient's Chart, lab work & pertinent test results  History of Anesthesia Complications Negative for: history of anesthetic complications  Airway Mallampati: II  TM Distance: >3 FB Neck ROM: Full    Dental no notable dental hx.    Pulmonary sleep apnea and Continuous Positive Airway Pressure Ventilation , neg COPD,    breath sounds clear to auscultation- rhonchi (-) wheezing      Cardiovascular hypertension, Pt. on medications (-) CAD, (-) Past MI, (-) Cardiac Stents and (-) CABG + dysrhythmias Atrial Fibrillation  Rhythm:Regular Rate:Normal - Systolic murmurs and - Diastolic murmurs    Neuro/Psych neg Seizures CVA, No Residual Symptoms negative psych ROS   GI/Hepatic negative GI ROS, Neg liver ROS,   Endo/Other  diabetes, Oral Hypoglycemic Agents  Renal/GU negative Renal ROS     Musculoskeletal negative musculoskeletal ROS (+)   Abdominal (+) + obese,   Peds  Hematology negative hematology ROS (+)   Anesthesia Other Findings Past Medical History: No date: Back pain No date: Dyspnea No date: Dysrhythmia     Comment:  a fib No date: Hypertension   Reproductive/Obstetrics                             Anesthesia Physical Anesthesia Plan  ASA: III  Anesthesia Plan: General   Post-op Pain Management:    Induction: Intravenous  PONV Risk Score and Plan: 2 and TIVA, Propofol infusion and Ondansetron  Airway Management Planned: Oral ETT  Additional Equipment:   Intra-op Plan:   Post-operative Plan: Extubation in OR  Informed Consent: I have reviewed the patients History and Physical, chart, labs and discussed the procedure including the risks, benefits and alternatives for the proposed anesthesia with the patient or authorized representative who  has indicated his/her understanding and acceptance.     Dental advisory given  Plan Discussed with: CRNA and Anesthesiologist  Anesthesia Plan Comments: (With neuromonitoring)        Anesthesia Quick Evaluation

## 2018-10-29 ENCOUNTER — Encounter: Payer: Self-pay | Admitting: Neurosurgery

## 2018-10-29 ENCOUNTER — Inpatient Hospital Stay: Payer: Medicare Other

## 2018-10-29 LAB — GLUCOSE, CAPILLARY
Glucose-Capillary: 160 mg/dL — ABNORMAL HIGH (ref 70–99)
Glucose-Capillary: 126 mg/dL — ABNORMAL HIGH (ref 70–99)
Glucose-Capillary: 88 mg/dL (ref 70–99)

## 2018-10-29 MED ORDER — OXYCODONE HCL 5 MG PO TABS: 5.0000 mg | ORAL_TABLET | Freq: Four times a day (QID) | ORAL | 0 refills | Status: DC | PRN

## 2018-10-29 MED ORDER — TIZANIDINE HCL 2 MG PO TABS: 2.0000 mg | ORAL_TABLET | Freq: Four times a day (QID) | ORAL | 0 refills | Status: DC | PRN

## 2018-10-29 NOTE — Progress Notes (Signed)
Discharge instructions given to patient and patient verified understanding.prescriptions given. No acute distress noted. Family notified of transfer home.

## 2018-10-29 NOTE — Evaluation (Signed)
Occupational Therapy Evaluation Patient Details Name: Autumn Herman MRN: [id number] DOB: 1945-10-19 Today's Date: 10/29/2018    History of Present Illness Pt is 73 year old woman s/p posterolateral decompression and fusion of L4-5.   Clinical Impression   Pt is 73 year old woman s/p L4/L5 decompression and fusion.  Pt was independent in all ADLs prior to surgery and lives with her daughter and son in law and their 68 year old. Her daughter does not work and is home taking care of 72 year old and is available to help as needed.  Pt is eager to return to PLOF.  Pt currently requires min assist for LB dressing while in seated position due to pain and limited trunk flexion due to precautions from fusion. Pain level was 0/10 throughout session until she stood up and then pain was 3/10 but quickly resolved to 0/10 when back in recliner sitting.  She is pleasant, cooperative and motivated to regain independence in ADLs.   Reviewed rec for precautions including no arching, bending or twisting and guidelines after surgery to minimize pain and prevent damage to surgical area.  Rec patient use reacher, sock aid, Pt would benefit from further instruction in dressing techniques with assistive devices for dressing and bathing skills.   No further OT recommended after discharge.     Follow Up Recommendations  No OT follow up    Equipment Recommendations  (reacher, LH shoe horn and sock aid)    Recommendations for Other Services       Precautions / Restrictions Precautions Precaution Comments: no bending, arching or twisting Restrictions Weight Bearing Restrictions: No      Mobility Bed Mobility                  Transfers                      Balance                                           ADL either performed or assessed with clinical judgement   ADL Overall ADL's : Needs assistance/impaired Eating/Feeding: Independent;Set up   Grooming: Wash/dry  hands;Wash/dry face;Oral care;Brushing hair;Set up;Independent   Upper Body Bathing: Independent;Set up   Lower Body Bathing: Minimal assistance;Set up   Upper Body Dressing : Independent;Set up   Lower Body Dressing: Minimal assistance;Set up   Toilet Transfer: Set up;Minimal assistance;Grab bars;Regular Toilet;RW             General ADL Comments: Pt is progressing well with ADLs and mobility and eager tor return to independent.  She has orders for no bending, twisting or arching after L4-5 decompression and fusion and educated in AD for dressing skills with good follow through or tech.  Pain was 0/10 during session until standing to simulate pants over hips which increased to 3/10 and then decreased to 0/10 when sitting in recliner again.     Vision Baseline Vision/History: No visual deficits Patient Visual Report: No change from baseline       Perception     Praxis      Pertinent Vitals/Pain Pain Assessment: No/denies pain     Hand Dominance Right   Extremity/Trunk Assessment Upper Extremity Assessment Upper Extremity Assessment: Overall WFL for tasks assessed   Lower Extremity Assessment Lower Extremity Assessment: Defer to PT evaluation   Cervical /  Trunk Assessment Cervical / Trunk Assessment: Normal   Communication Communication Communication: No difficulties   Cognition Arousal/Alertness: Awake/alert Behavior During Therapy: WFL for tasks assessed/performed Overall Cognitive Status: Within Functional Limits for tasks assessed                                     General Comments       Exercises     Shoulder Instructions      Home Living Family/patient expects to be discharged to:: Private residence Living Arrangements: Children Available Help at Discharge: Family Type of Home: Mobile home(double wide mobile home)       Home Layout: One level     Bathroom Shower/Tub: Walk-in shower;Curtain   Bathroom Toilet:  Standard Bathroom Accessibility: No   Home Equipment: Environmental consultant - 2 wheels;Shower seat - built in;Grab bars - tub/shower;Toilet riser          Prior Functioning/Environment Level of Independence: Independent        Comments: Pt was living at home with her daughter and son in law and 12 year old grand daughter        OT Problem List: Pain;Decreased strength;Impaired balance (sitting and/or standing);Decreased knowledge of use of DME or AE      OT Treatment/Interventions: Self-care/ADL training;Patient/family education;DME and/or AE instruction    OT Goals(Current goals can be found in the care plan section) Acute Rehab OT Goals Patient Stated Goal: to be independent again OT Goal Formulation: With patient Time For Goal Achievement: 11/12/18 Potential to Achieve Goals: Good ADL Goals Pt Will Perform Lower Body Dressing: with set-up;with supervision;with adaptive equipment;sit to/from stand Pt Will Transfer to Toilet: with set-up;stand pivot transfer;with min assist;regular height toilet;grab bars Pt Will Perform Toileting - Clothing Manipulation and hygiene: with set-up;with supervision;sit to/from stand  OT Frequency: Min 1X/week   Barriers to D/C:            Co-evaluation              AM-PAC OT "6 Clicks" Daily Activity     Outcome Measure Help from another person eating meals?: None Help from another person taking care of personal grooming?: None Help from another person toileting, which includes using toliet, bedpan, or urinal?: A Little Help from another person bathing (including washing, rinsing, drying)?: A Little Help from another person to put on and taking off regular upper body clothing?: None Help from another person to put on and taking off regular lower body clothing?: A Little 6 Click Score: 21   End of Session    Activity Tolerance: Patient tolerated treatment well Patient left: in chair;with call bell/phone within reach;with chair alarm set  OT  Visit Diagnosis: Pain;Other abnormalities of gait and mobility (R26.89) Pain - Right/Left: (low back when standing only) Pain - part of body: (low back in middle)                Time: [id number] OT Time Calculation (min): 26 min Charges:  OT General Charges $OT Visit: 1 Visit OT Evaluation $OT Eval Low Complexity: 1 Low OT Treatments $Self Care/Home Management : 8-22 mins  Chrys Racer, OTR/L, [place] ascom [phone] 10/29/18, 1:34 PM

## 2018-10-29 NOTE — Plan of Care (Signed)
Will continue with plan of care. 

## 2018-10-29 NOTE — Progress Notes (Signed)
Procedure: L4-5 XLIF with decompression Procedure date: 10/28/2018 Diagnosis: Neurogenic claudication  History: Autumn Herman is s/p L4-5 XLIF with decompression for neurogenic claudication POD 1: Recovering very well.  Pain currently rated 1/10.  Denies any lower extremity pain/numbness/tingling/weakness.  She has ambulated to bathroom without issue, voided without issue, and eating without issue.  No complaints at this time.  Pain has remained minimal and she is only been taking Tylenol and muscle relaxer for pain control.  POD0: Tolerated procedure well without complication.  Evaluated in postop recovery still disoriented from anesthesia.  Denies any pain at this time.  Physical Exam: Vitals:   10/29/18 0742 10/29/18 1140  BP: (!) 102/57 (!) 114/54  Pulse: 66 66  Resp:    Temp: 97.8 F (36.6 C) 98.7 F (37.1 C)  SpO2: 97% 93%     Strength: 5/5 throughout lower extremities Sensation: Intact and symmetric throughout lower extremities Skin: Lateral incision site: Dressing clean and dry Lumbar incisions: Dressing clean and dry  Data:  Recent Labs  Lab 10/23/18 1110  NA 135  K 3.5  CL 94*  CO2 30  BUN 17  CREATININE 1.08*  GLUCOSE 127*  CALCIUM 9.4   No results for input(s): AST, ALT, ALKPHOS in the last 168 hours.  Invalid input(s): TBILI   Recent Labs  Lab 10/23/18 1110  WBC 6.7  HGB 11.5*  HCT 35.1*  PLT 360   Recent Labs  Lab 10/23/18 1110  APTT 31  INR 1.0         Other tests/results:  EXAM: LUMBAR SPINE - 2-3 VIEW 10/29/2018  COMPARISON:  10/28/2018 and MRI 03/27/2018  FINDINGS: Vertebral body heights are normal. There is mild spondylosis throughout the lumbar spine and moderate spondylosis of the lower thoracic spine. Mild stable anterolisthesis of L4 on L5. No evidence of compression fracture. Posterior fusion hardware intact unchanged at the L4-5 level with interbody fusion at the L4-5 level. Remainder the exam is  unchanged.  IMPRESSION: Mild spondylosis of the lumbar spine. Stable mild anterolisthesis of L4 on L5.  Posterior fusion hardware with interbody fusion of L4-L5 unchanged.  Assessment/Plan:  Derrisha Brundige Conroy POD1 status post L4-L5 XLIF for neurogenic claudication.  She is recovering very well.  Symptoms that were present prior to surgery are resolved at this time.  Shortly after catheter removal she was able to void without issue.  She is also ambulated and eating without issue.  Pain adequately controlled with Tylenol and Robaxin at this time.  Will await PT and OT to determine if any home health services are recommended.  - mobilize - pain control - DVT prophylaxis  Marin Olp PA-C Department of Neurosurgery

## 2018-10-29 NOTE — TOC Progression Note (Signed)
Transition of Care [facility]) - Progression Note    Patient Details  Name: Autumn Herman MRN: [id number] Date of Birth: 01-23-1946  Transition of Care [facility]) CM/SW [place], RN Phone Number: 10/29/2018, 4:30 PM  Clinical Narrative:     Met with the patient to discuss DC plan and needs, Her daughter provides transportation She needs a RW, I notified Lyles with [employer].  She already has outpatient pT set up and will do that.  She has no other needs.        Expected Discharge Plan and Services                                                 Social Determinants of Health (SDOH) Interventions    Readmission Risk Interventions No flowsheet data found.

## 2018-10-29 NOTE — Evaluation (Signed)
Physical Therapy Evaluation Patient Details Name: Autumn Herman MRN: [id number] DOB: 02-Nov-1945 Today's Date: 10/29/2018   History of Present Illness  Pt is 73 year old woman s/p posterolateral decompression and fusion of L4-5 secondary to spondylolisthesis and neurogenic claudication. HIstory includes anemia, depression, DM, and HTN  Clinical Impression  Pt is a pleasant 73 year old female who was admitted for L4/L5 fusion with decompression. Pt performs bed mobility, transfers, and ambulation with cga and RW. Pt demonstrates deficits with strength/mobility/balance. Pt is very motivated to perform therapy. Recommending continued use of RW for all mobility at this time. Would benefit from skilled PT to address above deficits and promote optimal return to PLOF. Recommend transition to OP PT upon discharge from acute hospitalization.     Follow Up Recommendations Outpatient PT    Equipment Recommendations  Rolling walker with 5" wheels    Recommendations for Other Services       Precautions / Restrictions Precautions Precautions: Back Precaution Booklet Issued: No Precaution Comments: no bending, arching or twisting Restrictions Weight Bearing Restrictions: No      Mobility  Bed Mobility Overal bed mobility: Needs Assistance Bed Mobility: Supine to Sit     Supine to sit: Min guard     General bed mobility comments: educated and practiced log roll supine<>sit. Pt able to follow commands well. Able to sit with upright posture  Transfers Overall transfer level: Needs assistance Equipment used: Rolling walker (2 wheeled) Transfers: Sit to/from Stand Sit to Stand: Min guard         General transfer comment: cued for hand placement. All mobility performed on RA with sats WNL. UPright posture noted  Ambulation/Gait Ambulation/Gait assistance: Min guard Gait Distance (Feet): 220 Feet Assistive device: Rolling walker (2 wheeled) Gait Pattern/deviations: Step-through  pattern     General Gait Details: ambulated using RW and cga. Safe technique performed with upright posture. No fatigue present  Stairs Stairs: Yes Stairs assistance: Min guard Stair Management: One rail Left Number of Stairs: 4 General stair comments: demonstrated technique prior to performance. Safe technique with step to gait pattern.  Wheelchair Mobility    Modified Rankin (Stroke Patients Only)       Balance Overall balance assessment: Needs assistance Sitting-balance support: Feet supported Sitting balance-Leahy Scale: Good     Standing balance support: Bilateral upper extremity supported Standing balance-Leahy Scale: Good                               Pertinent Vitals/Pain Pain Assessment: No/denies pain    Home Living Family/patient expects to be discharged to:: Private residence Living Arrangements: Children(daughter and son in Sports coach) Available Help at Discharge: Family Type of Home: Mobile home Home Access: Stairs to enter Entrance Stairs-Rails: Can reach both(too wide to reach at same time) Entrance Stairs-Number of Steps: 3 Home Layout: One level Home Equipment: None      Prior Function Level of Independence: Independent         Comments: Pt was living at home with her daughter and son in law and 71 year old grand daughter. Currently working full time at [employer]. Active and doesn't use AD     Hand Dominance   Dominant Hand: Right    Extremity/Trunk Assessment   Upper Extremity Assessment Upper Extremity Assessment: Overall WFL for tasks assessed    Lower Extremity Assessment Lower Extremity Assessment: Overall WFL for tasks assessed    Cervical / Trunk Assessment Cervical /  Trunk Assessment: Normal  Communication   Communication: No difficulties  Cognition Arousal/Alertness: Awake/alert Behavior During Therapy: WFL for tasks assessed/performed Overall Cognitive Status: Within Functional Limits for tasks assessed                                         General Comments      Exercises Other Exercises Other Exercises: Pt ambulated to bathroom with cga. Able to perform self hygiene with supervision. Cues for back precautions during ADLs   Assessment/Plan    PT Assessment Patient needs continued PT services  PT Problem List Decreased strength;Decreased balance;Decreased activity tolerance;Decreased mobility;Decreased knowledge of use of DME       PT Treatment Interventions DME instruction;Gait training;Stair training;Therapeutic exercise;Balance training    PT Goals (Current goals can be found in the Care Plan section)  Acute Rehab PT Goals Patient Stated Goal: to be independent again PT Goal Formulation: With patient Time For Goal Achievement: 11/12/18 Potential to Achieve Goals: Good    Frequency 7X/week   Barriers to discharge        Co-evaluation               AM-PAC PT "6 Clicks" Mobility  Outcome Measure Help needed turning from your back to your side while in a flat bed without using bedrails?: A Little Help needed moving from lying on your back to sitting on the side of a flat bed without using bedrails?: A Little Help needed moving to and from a bed to a chair (including a wheelchair)?: A Little Help needed standing up from a chair using your arms (e.g., wheelchair or bedside chair)?: A Little Help needed to walk in hospital room?: A Little Help needed climbing 3-5 steps with a railing? : A Little 6 Click Score: 18    End of Session Equipment Utilized During Treatment: Gait belt Activity Tolerance: Patient tolerated treatment well Patient left: in chair;with chair alarm set Nurse Communication: Mobility status PT Visit Diagnosis: Muscle weakness (generalized) (M62.81);Difficulty in walking, not elsewhere classified (R26.2)    Time: 1009-1050 PT Time Calculation (min) (ACUTE ONLY): 41 min   Charges:   PT Evaluation $PT Eval Low Complexity: 1 Low PT  Treatments $Gait Training: 8-22 mins $Therapeutic Activity: 8-22 mins        Greggory Stallion, PT, DPT [phone]   Autumn Herman 10/29/2018, 2:48 PM

## 2018-10-29 NOTE — Discharge Summary (Signed)
Procedure: L4-5 XLIF with decompression Procedure date: 10/28/2018 Diagnosis: Neurogenic claudication  History: Autumn Herman is s/p L4-5 XLIF with decompression for neurogenic claudication  POD 1: Recovering very well.  Pain currently rated 1/10.  Denies any lower extremity pain/numbness/tingling/weakness.  She has ambulated to bathroom without issue, voided without issue, and eating without issue.  No complaints at this time.  Pain has remained minimal and she is only been taking Tylenol and muscle relaxer for pain control.  Update: Received PT and OT. Pain remains minimal.   POD0: Tolerated procedure well without complication.  Evaluated in postop recovery still disoriented from anesthesia.  Denies any pain at this time.  Physical Exam: Vitals:   10/29/18 1140 10/29/18 1555  BP: (!) 114/54 (!) 115/57  Pulse: 66 62  Resp:  18  Temp: 98.7 F (37.1 C) 97.8 F (36.6 C)  SpO2: 93% 95%     Strength: 5/5 throughout lower extremities Sensation: Intact and symmetric throughout lower extremities Skin: Lateral incision site: Dressing clean and dry Lumbar incisions: Dressing clean and dry  Data:  Recent Labs  Lab 10/23/18 1110  NA 135  K 3.5  CL 94*  CO2 30  BUN 17  CREATININE 1.08*  GLUCOSE 127*  CALCIUM 9.4   No results for input(s): AST, ALT, ALKPHOS in the last 168 hours.  Invalid input(s): TBILI   Recent Labs  Lab 10/23/18 1110  WBC 6.7  HGB 11.5*  HCT 35.1*  PLT 360   Recent Labs  Lab 10/23/18 1110  APTT 31  INR 1.0         Other tests/results:  EXAM: LUMBAR SPINE - 2-3 VIEW 10/29/2018  COMPARISON:  10/28/2018 and MRI 03/27/2018  FINDINGS: Vertebral body heights are normal. There is mild spondylosis throughout the lumbar spine and moderate spondylosis of the lower thoracic spine. Mild stable anterolisthesis of L4 on L5. No evidence of compression fracture. Posterior fusion hardware intact unchanged at the L4-5 level with interbody fusion at  the L4-5 level. Remainder the exam is unchanged.  IMPRESSION: Mild spondylosis of the lumbar spine. Stable mild anterolisthesis of L4 on L5.  Posterior fusion hardware with interbody fusion of L4-L5 unchanged.  Assessment/Plan:  Autumn Herman POD1 status post L4-L5 XLIF for neurogenic claudication.  She is recovering very well.  Symptoms that were present prior to surgery are resolved at this time.  Ambulated, voided, and ate without issue. Per PT, patient will receive walker. Will continue post op pain control with tylenol, muscle relaxer, and pain medication as needed. She is scheduled to follow up in approximately 2 week to monitor progress. Advised to contact office if any questions or concerns arise before then.    Marin Olp PA-C Department of Neurosurgery

## 2018-10-29 NOTE — Discharge Instructions (Signed)
Your surgeon has performed an operation on your lumbar spine (low back) to fuse two or more of the vertebrae (bones) together. This procedure is performed to treat a number of different spinal problems, including narrowing of the spinal canal (stenosis), herniated discs, degenerative changes, and injuries.  ° °Many times, patients feel better immediately after surgery and can "overdo it." Even if you feel well, it is important that you follow these activity guidelines. If you do not let your back heal properly from the surgery, you can increase the chance of return of your symptoms and other complications. The following are instructions to help in your recovery once you have been discharged from the hospital.  ° °* Do not take anti-inflammatory medications for 3 months after surgery (naproxen [Aleve], ibuprofen [Advil, Motrin], celecoxib [Celebrex], etc.). These medications can prevent your bones from healing properly.  ° °Activity  °   °No bending, lifting, or twisting ("BLT"). Avoid lifting objects heavier than 10 pounds (gallon milk jug).  Where possible, avoid household activities that involve lifting, bending, reaching, pushing, or pulling such as laundry, vacuuming, grocery shopping, and childcare. Try to arrange for help from friends and family for these activities while your back heals.  ° °Increase physical activity slowly as tolerated.  Taking short walks is encouraged, but avoid strenuous exercise. Do not jog, run, bicycle, lift weights, or participate in any other exercises unless specifically allowed by your doctor. Avoid prolonged sitting, including car rides.  ° °Talk to your doctor before resuming sexual activity.  ° °You should not drive until cleared by your doctor.  ° °Until released by your doctor, you should not return to work or school.  You should rest at home and let your body heal.  ° °You may shower three days after your surgery.  After showering, lightly dab your incision dry. Do not take  a tub bath or go swimming until approved by your doctor at your follow-up appointment.  ° °If your doctor ordered a lumbar brace for you, you should wear it whenever you are out of bed. You may remove it when lying down or sleeping. You should also wear it when riding in a car. Not all back surgeries require a lumbar brace.  ° °If you smoke, we strongly recommend that you quit.  Smoking has been proven to interfere with normal bone healing and will dramatically reduce the success rate of your surgery. Please contact [facility] (800-QUIT-NOW) and use the resources at www.[facility].com for assistance in stopping smoking.  ° °Surgical Incision  ° °If you have a dressing on your incision, you may remove it two days after your surgery. Keep your incision area clean and dry.  ° °If you have staples or stitches on your incision, you should have a follow up scheduled for removal. If you do not have staples or stitches, you will have steri-strips (small pieces of surgical tape) or Dermabond glue. The steri-strips/glue should begin to peel away within about a week (it is fine if the steri-strips fall off before then). If the strips are still in place one week after your surgery, you may gently remove them.  ° °Diet          ° ° You may return to your usual diet. Be sure to stay hydrated.  ° °When to Contact Us  ° °Although your surgery and recovery will likely be uneventful, you may have some residual numbness, aches, and pains in your back and/or legs. This is normal and should   improve in the next few weeks.  ° °However, should you experience any of the following, contact us immediately:  ° - New numbness or weakness  ° - Pain that is progressively getting worse, and is not relieved by your pain medications or rest  ° - Bleeding, redness, swelling, pain, or drainage from surgical incision  ° - Chills or flu-like symptoms  ° - Fever greater than 101.0 F (38.3 C)  ° - Problems with bowel or bladder functions  ° - Difficulty  breathing or shortness of breath  ° - Warmth, tenderness, or swelling in your calf  °Contact Information  ° - During office hours (Monday-Friday 9 am to 5 pm), please call your physician at [phone] ([place])  ° - After hours and weekends, please call the [facility] Operator at [phone] and ask for the Neurosurgery Resident On Call  ° - For a life-threatening emergency, call 911  °

## 2019-11-16 ENCOUNTER — Ambulatory Visit (INDEPENDENT_AMBULATORY_CARE_PROVIDER_SITE_OTHER): Payer: Medicare Other | Admitting: Internal Medicine

## 2019-11-16 VITALS — Ht 63.0 in

## 2019-11-16 DIAGNOSIS — I10 Essential (primary) hypertension: Secondary | ICD-10-CM | POA: Insufficient documentation

## 2019-11-16 DIAGNOSIS — G4733 Obstructive sleep apnea (adult) (pediatric): Secondary | ICD-10-CM

## 2019-11-16 DIAGNOSIS — G4730 Sleep apnea, unspecified: Secondary | ICD-10-CM | POA: Insufficient documentation

## 2019-11-16 DIAGNOSIS — E049 Nontoxic goiter, unspecified: Secondary | ICD-10-CM

## 2019-11-16 DIAGNOSIS — I482 Chronic atrial fibrillation, unspecified: Secondary | ICD-10-CM

## 2019-11-16 DIAGNOSIS — R569 Unspecified convulsions: Secondary | ICD-10-CM

## 2019-11-16 NOTE — Progress Notes (Signed)
[facility] [place], [place] [address]  Pulmonary Sleep Medicine   Office Visit Note  Patient Name: Autumn Herman DOB: 06/26/1945 MRN [id number]    Chief Complaint: Obstructive Sleep Apnea visit  Brief History:  Autumn Herman is seen today for OSA The patient has a 8 year history of sleep apnea. Patient is using PAP nightly.  The patient feels feels as though she is not fresh after sleeping with PAP.  The patient reports not feeling like she has had enough sleep from PAP use. Reported sleepiness is  About the same and the Epworth Sleepiness Score is 6 out of 24. The patient does not take naps. The patient complains of the following: Mask leak seems to be a problem and she feels it is contributing to the sleepiness  The compliance download shows  compliance with an average use time of 8:02 hours. The AHI is 0.5  The patient has had some limb movements disrupting sleep.  ROS  General: (-) fever, (-) chills, (-) night sweat Nose and Sinuses: (-) nasal stuffiness or itchiness, (-) postnasal drip, (-) nosebleeds, (-) sinus trouble. Mouth and Throat: (-) sore throat, (-) hoarseness. Neck: (-) swollen glands, (-) enlarged thyroid, (-) neck pain. Respiratory: - cough, - shortness of breath, - wheezing. Neurologic: - numbness, - tingling. Psychiatric: - anxiety, - depression   Current Medication: Outpatient Encounter Medications as of 11/16/2019  Medication Sig  . apixaban (ELIQUIS) 5 MG TABS tablet Take 1 tablet by mouth every 12 (twelve) hours.  . Cysteamine Bitartrate (PROCYSBI) 300 MG PACK Use 1 each 3 (three) times daily Use as instructed.  . gabapentin (NEURONTIN) 800 MG tablet Take by mouth.  Marland Kitchen glucose blood ([employer] BLOOD GLUCOSE TEST) test strip Use 1 each (1 strip total) 3 (three) times daily Use as instructed.  . venlafaxine XR (EFFEXOR-XR) 150 MG 24 hr capsule Take by mouth.  . [DISCONTINUED] glipiZIDE (GLUCOTROL XL) 2.5 MG 24 hr tablet Take by mouth.  Marland Kitchen  amLODipine (NORVASC) 10 MG tablet Take 10 mg by mouth daily.  Marland Kitchen atorvastatin (LIPITOR) 80 MG tablet Take 80 mg by mouth daily.  . Cholecalciferol (VITAMIN D-3 PO) Take 2,000 Units by mouth daily.  . Ferrous Sulfate (IRON) 142 (45 Fe) MG TBCR Take 1 tablet by mouth daily.  Marland Kitchen glipiZIDE (GLUCOTROL) 5 MG tablet Take 2.5 mg by mouth daily before breakfast.   . lisinopril-hydrochlorothiazide (ZESTORETIC) 20-25 MG tablet Take 1 tablet by mouth daily.  . metFORMIN (GLUCOPHAGE) 1000 MG tablet Take 1,000 mg by mouth 2 (two) times daily with a meal.  . oxyCODONE (OXY IR/ROXICODONE) 5 MG immediate release tablet Take 1 tablet (5 mg total) by mouth every 6 (six) hours as needed for severe pain or breakthrough pain.  Marland Kitchen tiZANidine (ZANAFLEX) 2 MG tablet Take 1-2 tablets (2-4 mg total) by mouth every 6 (six) hours as needed for muscle spasms.  . vitamin B-12 (CYANOCOBALAMIN) 1000 MCG tablet Take 1,000 mcg by mouth daily.  . [DISCONTINUED] amLODipine (NORVASC) 5 MG tablet Take 10 mg by mouth at bedtime.   . [DISCONTINUED] atorvastatin (LIPITOR) 10 MG tablet Take by mouth.  . [DISCONTINUED] gabapentin (NEURONTIN) 300 MG capsule Take 300 mg by mouth 3 (three) times daily.  . [DISCONTINUED] gabapentin (NEURONTIN) 800 MG tablet Take 800 mg by mouth 2 (two) times daily.  . [DISCONTINUED] lisinopril (PRINIVIL,ZESTRIL) 20 MG tablet Take 20 mg by mouth daily.  . [DISCONTINUED] lisinopril (ZESTRIL) 10 MG tablet Take by mouth.  . [DISCONTINUED] metFORMIN (  GLUCOPHAGE) 850 MG tablet Take by mouth.  . [DISCONTINUED] venlafaxine XR (EFFEXOR XR) 150 MG 24 hr capsule Take 150 mg by mouth at bedtime.   No facility-administered encounter medications on file as of 11/16/2019.    Surgical History: Past Surgical History:  Procedure Laterality Date  . ANTERIOR LUMBAR FUSION N/A 10/28/2018   Procedure: L4-5 XLIF, L4-5 PSFD (POSTERIOR FUSION WITH DECOMPRESSION);  Surgeon: Meade Maw, MD;  Location: [facility] ORS;  Service:  Neurosurgery;  Laterality: N/A;  . COLONOSCOPY WITH PROPOFOL N/A 03/10/2017   Procedure: COLONOSCOPY WITH PROPOFOL;  Surgeon: Lollie Sails, MD;  Location: [facility] ENDOSCOPY;  Service: Endoscopy;  Laterality: N/A;  . SHOULDER ARTHROSCOPY Left   . thyroid tumor      Medical History: Past Medical History:  Diagnosis Date  . Back pain   . Dyspnea   . Dysrhythmia    a fib  . Hypertension     Family History: Non contributory to the present illness  Social History: Social History   Socioeconomic History  . Marital status: Divorced    Spouse name: Not on file  . Number of children: Not on file  . Years of education: Not on file  . Highest education level: Not on file  Occupational History  . Not on file  Tobacco Use  . Smoking status: Never Smoker  . Smokeless tobacco: Never Used  Vaping Use  . Vaping Use: Never used  Substance and Sexual Activity  . Alcohol use: Not on file  . Drug use: Not on file  . Sexual activity: Not on file  Other Topics Concern  . Not on file  Social History Narrative  . Not on file   Social Determinants of Health   Financial Resource Strain:   . Difficulty of Paying Living Expenses: Not on file  Food Insecurity:   . Worried About Charity fundraiser in the Last Year: Not on file  . Ran Out of Food in the Last Year: Not on file  Transportation Needs:   . Lack of Transportation (Medical): Not on file  . Lack of Transportation (Non-Medical): Not on file  Physical Activity:   . Days of Exercise per Week: Not on file  . Minutes of Exercise per Session: Not on file  Stress:   . Feeling of Stress : Not on file  Social Connections:   . Frequency of Communication with Friends and Family: Not on file  . Frequency of Social Gatherings with Friends and Family: Not on file  . Attends Religious Services: Not on file  . Active Member of Clubs or Organizations: Not on file  . Attends Archivist Meetings: Not on file  . Marital Status:  Not on file  Intimate Partner Violence:   . Fear of Current or Ex-Partner: Not on file  . Emotionally Abused: Not on file  . Physically Abused: Not on file  . Sexually Abused: Not on file    Vital Signs: Height 5\' 3"  (1.6 m).  Examination: General Appearance: The patient is well-developed, well-nourished, and in no distress. Neck Circumference: 38 Skin: Gross inspection of skin unremarkable. Head: normocephalic, no gross deformities. Eyes: no gross deformities noted. ENT: ears appear grossly normal Neurologic: Alert and oriented. No involuntary movements.    EPWORTH SLEEPINESS SCALE:  Scale:  (0)= no chance of dozing; (1)= slight chance of dozing; (2)= moderate chance of dozing; (3)= high chance of dozing  Chance  Situtation    Sitting and reading: 2  Watching TV: 0    Sitting Inactive in public: 0    As a passenger in car: 1      Lying down to rest: 2    Sitting and talking: 0    Sitting quielty after lunch: 1    In a car, stopped in traffic: 0   TOTAL SCORE:   6 out of 24    SLEEP STUDIES:  1. PSG6/2017 AHI 14 SpO35min 76%   CPAP COMPLIANCE DATA:  Date Range: 10/16/2019-11/14/2019  Average Daily Use: 8:02 hours  Median Use: 7:22  Compliance for > 4 Hours: 30 days  AHI: 0.5 respiratory events per hour  Days Used: 30/30  Mask Leak: 50.6  95th Percentile Pressure: 11.2         LABS: No results found for this or any previous visit (from the past 2160 hour(s)).  Radiology: No results found.  No results found.  No results found.    Assessment and Plan: Patient Active Problem List   Diagnosis Date Noted  . Hypertension 11/16/2019  . Sleep apnea 11/16/2019  . S/P lumbar fusion 10/28/2018  . Seizures ([place]) 07/01/2016  . Atrial fibrillation ([place]) 09/07/2013  . CVA (cerebral vascular accident) ([place]) 09/07/2013  . Goiter 09/07/2013      The patient is able to tolerate PAP and reports some benefit from PAP use. The patient  was reminded how to adjust mask to prevent leak and advised to keep machine clean also. The patient was also counselled on sleep hygiene. The compliance is excellent. The AHI is 0.5.   1. OSA excellent compliance will continue with current settings 2. HTN follow up with PCP 3. history of seizures no active seizures noted 4. RLS may need iron studies will follow with PCP  General Counseling: I have discussed the findings of the evaluation and examination with Vaughan Basta.  I have also discussed any further diagnostic evaluation thatmay be needed or ordered today. Paislynn verbalizes understanding of the findings of todays visit. We also reviewed her medications today and discussed drug interactions and side effects including but not limited excessive drowsiness and altered mental states. We also discussed that there is always a risk not just to her but also people around her. she has been encouraged to call the office with any questions or concerns that should arise related to todays visit.  No orders of the defined types were placed in this encounter.       I have personally obtained a history, examined the patient, evaluated laboratory and imaging results, formulated the assessment and plan and placed orders.   Richelle Ito Saunders Glance, PhD, FAASM  Diplomate, American Board of Sleep Medicine    Allyne Gee, MD [facility] Diplomate ABMS Pulmonary and Critical Care Medicine Sleep medicine

## 2019-11-16 NOTE — Patient Instructions (Signed)

## 2019-12-15 ENCOUNTER — Encounter (INDEPENDENT_AMBULATORY_CARE_PROVIDER_SITE_OTHER): Payer: Medicare Other | Admitting: Internal Medicine

## 2019-12-15 DIAGNOSIS — G4733 Obstructive sleep apnea (adult) (pediatric): Secondary | ICD-10-CM

## 2019-12-16 NOTE — Procedures (Signed)
[place] Report Part I                                                               Phone: [phone] Fax: [phone]  Patient Name: Autumn Herman, Autumn Herman Acquisition Number: [id number]  Date of Birth: [birth year] Acquisition Date: 12/15/2019  Referring Physician: Eulogio Bear, NP     History: The patient is a 74 year old female who was referred for re-evaluation of sleep apnea.  Medical History: back pain, dyspnea, atrial fibrillation, hypertension, sleep apnea, cerebral vascular accident, seizures, goiter  Medications: gabapentin, venlafaxine, amlodipine, atorvastatin, ferrous sulfate, glipizide, Zestoretic, metformin, oxycodone, tizanidine, apixaban, cysteamine bitartrate.  Procedure: This routine overnight polysomnogram was performed on the [facility] 5 using the standard diagnostic protocol. This included 6 channels of EEG, 2 channels of EOG, chin EMG, bilateral anterior tibialis EMG, nasal/oral thermistor, PTAF (nasal pressure transducer), chest and abdominal wall movements, EKG, and pulse oximetry.  Description: The total recording time was 485.7 minutes. The total sleep time was 409.5 minutes. There were a total of 60.2 minutes of wakefulness after sleep onset for a?reduced????sleep efficiency of 84.3%. The latency to sleep onset was?within normal limits at 16.0 minutes. The R sleep onset latency was?prolonged at 356.0 minutes. Sleep parameters, as a percentage of the total sleep time, demonstrated 21.0% of sleep was in N1 sleep, 66.3% N2, 0.0% N3 and 12.7% R sleep. There were a total of 229 arousals for an arousal index of 33.6 arousals per hour of sleep that was elevated.  Respiratory monitoring demonstrated moderate to severe snoring in all positions. There were 44 apneas and hypopneas for an Apnea Hypopnea Index of 6.4 apneas and hypopneas per hour of sleep. The REM related apnea hypopnea index was 16.2/hr of REM sleep compared to a NREM AHI of 5.0/hr.  The  average duration of the respiratory events was 30.1 seconds with a maximum duration of 84.5 seconds. The respiratory events occurred in all positions. The respiratory events were associated with peripheral oxygen desaturations on the average to 92%. The lowest oxygen desaturation associated with a respiratory event was 82%. Additionally, the baseline oxygen saturation during wakefulness was 90%, during NREM sleep averaged 96%, and during REM sleep averaged 94%. The total duration of oxygen < 90% was 10.7 minutes and <80% was 3.4 minutes.  Cardiac monitoring- did not demonstrate transient cardiac decelerations associated with the apneas. There were no significant cardiac rhythm irregularities.   Periodic limb movement monitoring- demonstrated that there were 1208 periodic limb movements for a periodic limb movement index of 177.0 periodic limb movements per hour of sleep. Quasi-periodic limb movements and fragmentary myoclonus were observed during periods of wakefulness.  Impression: ???This routine overnight polysomnogram confirmed the presence of significant obstructive sleep apnea with an overall Apnea Hypopnea Index of 6.4 apneas and hypopneas per hour of sleep which increased to 16.2 in REM sleep. As REM percentage was reduced and as the patient is using CPAP nightly, the findings likely underestimate the severity of the sleep apnea.  There was a highly elevated periodic limb movement index of 177.0 periodic limb movements per hour of sleep. In addition, quasi-periodic limb movements and fragmentary myoclonus were observed during periods of wakefulness. The patient reports poorly refreshing sleep and daytime sleepiness along  with symptoms of restless leg syndrome. Treatment may improve sleep quality and alleviate sleepiness if clinically appropriate.  There was a reduced sleep efficiency with an elevated arousal index, a reduced REM percentage and no N3 sleep. These findings would appear to be due to  the combination of obstructive sleep apnea and periodic limb movements.   ?Recommendations:    1. The patient was still noted to have MILD sleep apnea which appears to be worse during Stage R sleep. Would continue CPAP at current pressure settings. 2. Patient has a significant increase in the PLMS which may indicate also presence of RLS so therefore would recommend to check iron and ferritin levels and treat as appropriate. 3. Follow up in sleep clinic to discuss the results and recommendations. 4. Would recommend weight loss in a patient with a BMI of 33.7.    Autumn Gee, MD, [facility] Diplomate ABMS Pulmonary and Critical Care Medicine Sleep Medicine  Electronically reviewed and digitally signed     [place] Report Part II  Phone: [phone] Fax: [phone]  Patient last name [name] Neck Size 13.5 in. Acquisition [phone]  Patient first name Kylena Weight 190.0 lbs. Started 12/15/2019 at 9:42:06 PM  Birth date [birth year] Height 63.0 in. Stopped 12/16/2019 at 5:47:48 AM  Age 24 BMI 33.7 lb/in2 Duration 485.7  Study Type Adult      M.Starke, RPGST Sleep Data: Lights Out: 9:42:06 PM Sleep Onset: 9:58:06 PM  Lights On: 5:47:48 AM Sleep Efficiency: 84.3 %  Total Recording Time: 485.7 min Sleep Latency (from Lights Off) 16.0 min  Total Sleep Time (TST): 409.5 min R Latency (from Sleep Onset): 356.0 min  Sleep Period Time: 462.5 min Total number of awakenings: 9  Wake during sleep: 53.0 min Wake After Sleep Onset (WASO): 60.2 min   Sleep Data:         Arousal Summary: Stage  Latency from lights out (min) Latency from sleep onset (min) Duration (min) % Total Sleep Time  Normal values  N 1 16.0 0.0 86.0 21.0 (5%)  N 2 19.5 3.5 271.5 66.3 (50%)  N 3       0.0 0.0 (20%)  R 372.0 356.0 52.0 12.7 (25%)    Number Index  Spontaneous 63 9.2  Apneas & Hypopneas 25 3.7  RERAs 0 0.0       (Apneas & Hypopneas & RERAs)  (25) (3.7)  Limb Movement 146  21.4  Snore 0 0.0  TOTAL 234 34.3      Respiratory Data:  CA OA MA Apnea Hypopnea* A+ H RERA Total  Number 0 2 5 7  37 44 0 44  Mean Dur (sec) 0.0 46.8 27.1 32.7 29.6 30.1 0.0 30.1  Max Dur (sec) 0.0 71.0 39.0 71.0 84.5 84.5 0.0 84.5  Total Dur (min) 0.0 1.6 2.3 3.8 18.3 22.1 0.0 22.1  % of TST 0.0 0.4 0.6 0.9 4.5 5.4 0.0 5.4  Index (#/h TST) 0.0 0.3 0.7 1.0 5.4 6.4 0.0 6.4  *Hypopneas scored based on 4% or greater desaturation.  Sleep Stage:        REM NREM TST  AHI 16.2 5.0 6.4  RDI 16.2 5.0 6.4            Body Position Data:  Sleep (min) TST (%) REM (min) NREM (min) CA (#) OA (#) MA (#) HYP (#) AHI (#/h) RERA (#) RDI (#/h) Desat (#)  Supine 256.5 62.64 0.0 256.5 0 2 5 17  5.6 0 5.6 21  Non-Supine 153.00 37.36 52.00 101.00 0.00 0.00 0.00 20.00 7.84 0 7.84 23.00  Left: 152.9 37.34 52.0 100.9 0 0 0 20 7.8 0 7.8 23  Prone: 0.1 0.02 0.0 0.1 0 0 0 0 0.0 0 0.00 0  UP: 0.0 0.00 0.0 0.0 0 0 0 0 0.0 0 0.00 0     Snoring: Total number of snoring episodes  0  Total time with snoring    min (   % of sleep)   Oximetry Distribution:             WK REM NREM TOTAL  Average (%)   90 94 96 95  < 90% 10.6 0.0 0.1 10.7  < 80% 3.4 0.0 0.0 3.4  < 70% 2.1 0.0 0.0 2.1  # of Desaturations* 0 15 29 44  Desat Index (#/hour) 0.0 17.3 4.9 6.4  Desat Max (%) 0 8 17 17   Desat Max Dur (sec) 0.0 79.0 87.0 87.0  Approx Min O2 during sleep 82  Approx min O2 during a respiratory event 82  Was Oxygen added (Y/N) and final rate No:   0 LPM  *Desaturations based on 3% or greater drop from baseline.   Cheyne Stokes Breathing: None Present   Heart Rate Summary:  Average Heart Rate During Sleep 62.3 bpm      Highest Heart Rate During Sleep (95th %) 69.0 bpm      Highest Heart Rate During Sleep 169 bpm (artifact)  Highest Heart Rate During Recording (TIB) 169 bpm (artifact)   Heart Rate Observations: Event Type # Events   Bradycardia 0 Lowest HR Scored: N/A  Sinus Tachycardia  During Sleep 0 Highest HR Scored: N/A  Narrow Complex Tachycardia 0 Highest HR Scored: N/A  Wide Complex Tachycardia 0 Highest HR Scored: N/A  Asystole 0 Longest Pause: N/A  Atrial Fibrillation 0 Duration Longest Event: N/A  Other Arrythmias  No Type:    Periodic Limb Movement Data: (Primary legs unless otherwise noted) Total # Limb Movement 1208 Limb Movement Index 177.0  Total # PLMS 1208 PLMS Index 177.0  Total # PLMS Arousals 146 PLMS Arousal Index 21.4  Percentage Sleep Time with PLMS 312.89min (76.4 % sleep)  Mean Duration limb movements (secs) 293.1

## 2019-12-29 ENCOUNTER — Ambulatory Visit (INDEPENDENT_AMBULATORY_CARE_PROVIDER_SITE_OTHER): Payer: Medicare Other | Admitting: Internal Medicine

## 2019-12-29 VITALS — BP 143/73 | HR 72 | Temp 98.6°F | Resp 18 | Ht 63.0 in | Wt 195.0 lb

## 2019-12-29 DIAGNOSIS — I482 Chronic atrial fibrillation, unspecified: Secondary | ICD-10-CM | POA: Diagnosis not present

## 2019-12-29 DIAGNOSIS — G4761 Periodic limb movement disorder: Secondary | ICD-10-CM | POA: Diagnosis not present

## 2019-12-29 DIAGNOSIS — G4733 Obstructive sleep apnea (adult) (pediatric): Secondary | ICD-10-CM | POA: Insufficient documentation

## 2019-12-29 DIAGNOSIS — Z7189 Other specified counseling: Secondary | ICD-10-CM | POA: Diagnosis not present

## 2019-12-29 DIAGNOSIS — Z9989 Dependence on other enabling machines and devices: Secondary | ICD-10-CM

## 2019-12-29 NOTE — Progress Notes (Signed)
[facility] [place], [place] [address]  Pulmonary Sleep Medicine   Office Visit Note  Patient Name: Autumn Herman DOB: [birth year] MRN [id number]    Chief Complaint: Obstructive Sleep Apnea visit  Brief History:  Karalynn is seen today for follow up after her recent repeat PSG.  The patient has a 9 year history of sleep apnea. Patient is using PAP nightly.  She recently had a repeat sleep study demonstrating mild sleep apnea and very frequent PLMS.The patient feels tired after sleeping with PAP.  the Epworth Sleepiness Score is 8 out of 24. TThe compliance download shows excellent compliance with an average use time of 8.2 hours. The AHI is 0.4  The patient has no complaints of limb movements disrupting sleep.  ROS  General: (-) fever, (-) chills, (-) night sweat Nose and Sinuses: (-) nasal stuffiness or itchiness, (-) postnasal drip, (-) nosebleeds, (-) sinus trouble. Mouth and Throat: (-) sore throat, (-) hoarseness. Neck: (-) swollen glands, (-) enlarged thyroid, (-) neck pain. Respiratory: - cough, - shortness of breath, - wheezing. Neurologic: - numbness, - tingling. Psychiatric: - anxiety, - depression   Current Medication: Outpatient Encounter Medications as of 12/29/2019  Medication Sig  . amLODipine (NORVASC) 10 MG tablet Take 10 mg by mouth daily.  Marland Kitchen apixaban (ELIQUIS) 5 MG TABS tablet Take 1 tablet by mouth every 12 (twelve) hours.  Marland Kitchen atorvastatin (LIPITOR) 80 MG tablet Take 80 mg by mouth daily.  . Cholecalciferol (VITAMIN D-3 PO) Take 2,000 Units by mouth daily.  . Cysteamine Bitartrate (PROCYSBI) 300 MG PACK Use 1 each 3 (three) times daily Use as instructed.  . Ferrous Sulfate (IRON) 142 (45 Fe) MG TBCR Take 1 tablet by mouth daily.  Marland Kitchen gabapentin (NEURONTIN) 800 MG tablet Take by mouth.  Marland Kitchen glipiZIDE (GLUCOTROL) 5 MG tablet Take 2.5 mg by mouth daily before breakfast.   . glucose blood ([employer] BLOOD GLUCOSE TEST) test strip Use 1 each (1  strip total) 3 (three) times daily Use as instructed.  Marland Kitchen lisinopril-hydrochlorothiazide (ZESTORETIC) 20-25 MG tablet Take 1 tablet by mouth daily.  . metFORMIN (GLUCOPHAGE) 1000 MG tablet Take 1,000 mg by mouth 2 (two) times daily with a meal.  . oxyCODONE (OXY IR/ROXICODONE) 5 MG immediate release tablet Take 1 tablet (5 mg total) by mouth every 6 (six) hours as needed for severe pain or breakthrough pain.  Marland Kitchen tiZANidine (ZANAFLEX) 2 MG tablet Take 1-2 tablets (2-4 mg total) by mouth every 6 (six) hours as needed for muscle spasms.  Marland Kitchen venlafaxine XR (EFFEXOR-XR) 150 MG 24 hr capsule Take by mouth.  . vitamin B-12 (CYANOCOBALAMIN) 1000 MCG tablet Take 1,000 mcg by mouth daily.   No facility-administered encounter medications on file as of 12/29/2019.    Surgical History: Past Surgical History:  Procedure Laterality Date  . ANTERIOR LUMBAR FUSION N/A 10/28/2018   Procedure: L4-5 XLIF, L4-5 PSFD (POSTERIOR FUSION WITH DECOMPRESSION);  Surgeon: Meade Maw, MD;  Location: [facility] ORS;  Service: Neurosurgery;  Laterality: N/A;  . COLONOSCOPY WITH PROPOFOL N/A 03/10/2017   Procedure: COLONOSCOPY WITH PROPOFOL;  Surgeon: Lollie Sails, MD;  Location: [facility] ENDOSCOPY;  Service: Endoscopy;  Laterality: N/A;  . SHOULDER ARTHROSCOPY Left   . thyroid tumor      Medical History: Past Medical History:  Diagnosis Date  . Atrial fibrillation ([place])   . Back pain   . Dyspnea   . Dysrhythmia    a fib  . Hypertension   . Hypertension   .  Seizures ([place])   . Sleep apnea   . Stroke (cerebrum) ([place])     Family History: Non contributory to the present illness  Social History: Social History   Socioeconomic History  . Marital status: Divorced    Spouse name: Not on file  . Number of children: Not on file  . Years of education: Not on file  . Highest education level: Not on file  Occupational History  . Not on file  Tobacco Use  . Smoking status: Never Smoker  . Smokeless tobacco:  Never Used  Vaping Use  . Vaping Use: Never used  Substance and Sexual Activity  . Alcohol use: Not Currently  . Drug use: Never  . Sexual activity: Not Currently  Other Topics Concern  . Not on file  Social History Narrative  . Not on file   Social Determinants of Health   Financial Resource Strain:   . Difficulty of Paying Living Expenses: Not on file  Food Insecurity:   . Worried About Charity fundraiser in the Last Year: Not on file  . Ran Out of Food in the Last Year: Not on file  Transportation Needs:   . Lack of Transportation (Medical): Not on file  . Lack of Transportation (Non-Medical): Not on file  Physical Activity:   . Days of Exercise per Week: Not on file  . Minutes of Exercise per Session: Not on file  Stress:   . Feeling of Stress : Not on file  Social Connections:   . Frequency of Communication with Friends and Family: Not on file  . Frequency of Social Gatherings with Friends and Family: Not on file  . Attends Religious Services: Not on file  . Active Member of Clubs or Organizations: Not on file  . Attends Archivist Meetings: Not on file  . Marital Status: Not on file  Intimate Partner Violence:   . Fear of Current or Ex-Partner: Not on file  . Emotionally Abused: Not on file  . Physically Abused: Not on file  . Sexually Abused: Not on file    Vital Signs: Blood pressure (!) 143/73, pulse 72, temperature 98.6 F (37 C), temperature source Temporal, resp. rate 18, height 5\' 3"  (1.6 m), weight 195 lb (88.5 kg), SpO2 97 %.  Examination: General Appearance: The patient is well-developed, well-nourished, and in no distress. Neck Circumference: 39 cm Skin: Gross inspection of skin unremarkable. Head: normocephalic, no gross deformities. Eyes: no gross deformities noted. ENT: ears appear grossly normal Neurologic: Alert and oriented. No involuntary movements.    EPWORTH SLEEPINESS SCALE:  Scale:  (0)= no chance of dozing; (1)= slight  chance of dozing; (2)= moderate chance of dozing; (3)= high chance of dozing  Chance  Situtation    Sitting and reading: 2    Watching TV: 2    Sitting Inactive in public: 0    As a passenger in car: 1      Lying down to rest: 3    Sitting and talking: 0    Sitting quielty after lunch: 0    In a car, stopped in traffic: 0   TOTAL SCORE:   8 out of 24    SLEEP STUDIES:  1. PSG 02/2010 AHI 7 SpO70min 89%   CPAP COMPLIANCE DATA:  Date Range: 11/27/19-12/26/19  Average Daily Use: 8.2 hours  Median Use: 7.6  Compliance for > 4 Hours: 97%  AHI: 0.4 respiratory events per hour  Days Used: 29/30  Mask Leak: 5  95th Percentile Pressure: 11.6  LABS: No results found for this or any previous visit (from the past 2160 hour(s)).  Radiology: No results found.  No results found.  No results found.    Assessment and Plan: Patient Active Problem List   Diagnosis Date Noted  . OSA on CPAP 12/29/2019  . CPAP use counseling 12/29/2019  . Hypertension 11/16/2019  . Sleep apnea 11/16/2019  . S/P lumbar fusion 10/28/2018  . Seizures ([place]) 07/01/2016  . Atrial fibrillation ([place]) 09/07/2013  . CVA (cerebral vascular accident) ([place]) 09/07/2013  . Goiter 09/07/2013      The results of her recent PSG were discussed.The patient does tolerate PAP and reports significant benefit from PAP use. The patient was reminded how to clean the CPAP. She has very frequent PLMS per the study and reports poorly refreshing sleep despite adequate sleep time and excellent control of her apnea. She will get iron and ferritin studies before discussing possible medication. She is currently taking ferrous sulfate. She used to have RLS symptoms but is not currently experiencing them. The compliance is excellent. The apnea is very well controlled.   1. OSA- continue excellent compliance with APAP at 5-12 cm H2O 2. PLMD- will follow up after iron and ferritin results, placed order for labs but  she in unaware of a [facility] near her, advised to discuss having these levels checked by PCP and have them send results to our office 3. CPAP couseling-Discussed importance of adequate CPAP use as well as proper care and cleaning techniques of machine and all supplies. 4. A. Fib-Rate continues to be well controlled, followed and managed by cardiology  General Counseling: I have discussed the findings of the evaluation and examination with Vaughan Basta.  I have also discussed any further diagnostic evaluation thatmay be needed or ordered today. Carylon verbalizes understanding of the findings of todays visit. We also reviewed her medications today and discussed drug interactions and side effects including but not limited excessive drowsiness and altered mental states. We also discussed that there is always a risk not just to her but also people around her. she has been encouraged to call the office with any questions or concerns that should arise related to todays visit.  Orders Placed This Encounter  Procedures  . Fe+TIBC+Fer        I have personally obtained a history, examined the patient, evaluated laboratory and imaging results, formulated the assessment and plan and placed orders.  This patient was seen by Luiz Ochoa, AGNP-C in collaboration with Dr. Devona Konig as a part of collaborative care agreement.  Richelle Ito Saunders Glance, PhD, FAASM  Diplomate, American Board of Sleep Medicine    Allyne Gee, MD [facility] Diplomate ABMS Pulmonary and Critical Care Medicine Sleep medicine

## 2019-12-29 NOTE — Patient Instructions (Signed)

## 2021-01-24 ENCOUNTER — Ambulatory Visit (INDEPENDENT_AMBULATORY_CARE_PROVIDER_SITE_OTHER): Payer: Medicare Other | Admitting: Internal Medicine

## 2021-01-24 DIAGNOSIS — I482 Chronic atrial fibrillation, unspecified: Secondary | ICD-10-CM | POA: Diagnosis not present

## 2021-01-24 DIAGNOSIS — Z6834 Body mass index (BMI) 34.0-34.9, adult: Secondary | ICD-10-CM | POA: Diagnosis not present

## 2021-01-24 DIAGNOSIS — Z9989 Dependence on other enabling machines and devices: Secondary | ICD-10-CM

## 2021-01-24 DIAGNOSIS — I10 Essential (primary) hypertension: Secondary | ICD-10-CM

## 2021-01-24 DIAGNOSIS — Z7189 Other specified counseling: Secondary | ICD-10-CM

## 2021-01-24 DIAGNOSIS — G4733 Obstructive sleep apnea (adult) (pediatric): Secondary | ICD-10-CM

## 2021-01-24 DIAGNOSIS — E669 Obesity, unspecified: Secondary | ICD-10-CM

## 2021-01-24 NOTE — Patient Instructions (Signed)

## 2021-01-24 NOTE — Progress Notes (Signed)
[facility] [place], [place] [address]  Pulmonary Sleep Medicine   Office Visit Note  Patient Name: Autumn Herman DOB: 10/11/1945 MRN [id number]    Chief Complaint: Obstructive Sleep Apnea visit  Brief History:  Autumn Herman is seen today for annual follow up visit for APAP@ 5-12 cmH2O. The patient has a 10 year history of sleep apnea. Patient is using PAP nightly.  The patient feels rested after sleeping with PAP.  The patient reports benefiting from PAP use. Reported sleepiness is  improved and the Epworth Sleepiness Score is 8 out of 24. The patient does not take naps. The patient complains of the following: none.  The compliance download shows  compliance with an average use time of 8 hours 40  minutes. The AHI is 0.5.  The patient does not complain of limb movements disrupting sleep.  ROS  General: (-) fever, (-) chills, (-) night sweat Nose and Sinuses: (-) nasal stuffiness or itchiness, (-) postnasal drip, (-) nosebleeds, (-) sinus trouble. Mouth and Throat: (-) sore throat, (-) hoarseness. Neck: (-) swollen glands, (-) enlarged thyroid, (-) neck pain. Respiratory: - cough, - shortness of breath, - wheezing. Neurologic: + numbness, + tingling. Psychiatric: + anxiety, - depression   Current Medication: Outpatient Encounter Medications as of 01/24/2021  Medication Sig   amLODipine (NORVASC) 10 MG tablet Take 10 mg by mouth daily.   apixaban (ELIQUIS) 5 MG TABS tablet Take 1 tablet by mouth every 12 (twelve) hours.   atorvastatin (LIPITOR) 80 MG tablet Take 80 mg by mouth daily.   Cholecalciferol (VITAMIN D-3 PO) Take 2,000 Units by mouth daily.   Cysteamine Bitartrate (PROCYSBI) 300 MG PACK Use 1 each 3 (three) times daily Use as instructed.   Ferrous Sulfate (IRON) 142 (45 Fe) MG TBCR Take 1 tablet by mouth daily.   gabapentin (NEURONTIN) 800 MG tablet Take by mouth.   glipiZIDE (GLUCOTROL) 5 MG tablet Take 2.5 mg by mouth daily before breakfast.     glucose blood ([employer] BLOOD GLUCOSE TEST) test strip Use 1 each (1 strip total) 3 (three) times daily Use as instructed.   lisinopril-hydrochlorothiazide (ZESTORETIC) 20-25 MG tablet Take 1 tablet by mouth daily.   metFORMIN (GLUCOPHAGE) 1000 MG tablet Take 1,000 mg by mouth 2 (two) times daily with a meal.   venlafaxine XR (EFFEXOR-XR) 150 MG 24 hr capsule Take by mouth.   vitamin B-12 (CYANOCOBALAMIN) 1000 MCG tablet Take 1,000 mcg by mouth daily.   [DISCONTINUED] oxyCODONE (OXY IR/ROXICODONE) 5 MG immediate release tablet Take 1 tablet (5 mg total) by mouth every 6 (six) hours as needed for severe pain or breakthrough pain.   [DISCONTINUED] tiZANidine (ZANAFLEX) 2 MG tablet Take 1-2 tablets (2-4 mg total) by mouth every 6 (six) hours as needed for muscle spasms.   No facility-administered encounter medications on file as of 01/24/2021.    Surgical History: Past Surgical History:  Procedure Laterality Date   ANTERIOR LUMBAR FUSION N/A 10/28/2018   Procedure: L4-5 XLIF, L4-5 PSFD (POSTERIOR FUSION WITH DECOMPRESSION);  Surgeon: Meade Maw, MD;  Location: [facility] ORS;  Service: Neurosurgery;  Laterality: N/A;   COLONOSCOPY WITH PROPOFOL N/A 03/10/2017   Procedure: COLONOSCOPY WITH PROPOFOL;  Surgeon: Lollie Sails, MD;  Location: [facility] ENDOSCOPY;  Service: Endoscopy;  Laterality: N/A;   SHOULDER ARTHROSCOPY Left    thyroid tumor      Medical History: Past Medical History:  Diagnosis Date   Atrial fibrillation ([facility])    Back pain  Dyspnea    Dysrhythmia    a fib   Hypertension    Hypertension    Seizures ([facility])    Sleep apnea    Stroke (cerebrum) ([place])     Family History: Non contributory to the present illness  Social History: Social History   Socioeconomic History   Marital status: Divorced    Spouse name: Not on file   Number of children: Not on file   Years of education: Not on file   Highest education level: Not on file  Occupational History   Not on file   Tobacco Use   Smoking status: Never   Smokeless tobacco: Never  Vaping Use   Vaping Use: Never used  Substance and Sexual Activity   Alcohol use: Not Currently   Drug use: Never   Sexual activity: Not Currently  Other Topics Concern   Not on file  Social History Narrative   Not on file   Social Determinants of Health   Financial Resource Strain: Not on file  Food Insecurity: Not on file  Transportation Needs: Not on file  Physical Activity: Not on file  Stress: Not on file  Social Connections: Not on file  Intimate Partner Violence: Not on file    Vital Signs: Blood pressure (!) 152/74, pulse 68, resp. rate 18, height 5\' 3"  (1.6 m), weight 195 lb (88.5 kg), SpO2 97 %. Body mass index is 34.54 kg/m.    Examination: General Appearance: The patient is well-developed, well-nourished, and in no distress. Neck Circumference: 39 cm Skin: Gross inspection of skin unremarkable. Head: normocephalic, no gross deformities. Eyes: no gross deformities noted. ENT: ears appear grossly normal Neurologic: Alert and oriented. No involuntary movements.    EPWORTH SLEEPINESS SCALE:  Scale:  (0)= no chance of dozing; (1)= slight chance of dozing; (2)= moderate chance of dozing; (3)= high chance of dozing  Chance  Situtation    Sitting and reading: 1    Watching TV: 1    Sitting Inactive in public: 1    As a passenger in car: 1      Lying down to rest: 3    Sitting and talking: 0    Sitting quielty after lunch: 1    In a car, stopped in traffic: 0   TOTAL SCORE:   8 out of 24    SLEEP STUDIES:  1. PSG (02/2010) AHI 7/hr, min SpO2 89% 2. Titraion (02/2010) CPAP @ 9 cmH2O 3. PSG (07/2015) AHI 14/hr, min SpO2 76%,  4. Titration (07/2015) CPAP@ 7 cmH2O   CPAP COMPLIANCE DATA:  Date Range: 01/26/2020-01/24/2021  Average Daily Use: 8 hours 40  minutes  Median Use: 8 hours 10 minutes  Compliance for > 4 Hours: 99%   AHI: 0.5 respiratory events per  hour  Days Used: 364/365 days  Mask Leak: 2.5  95th Percentile Pressure: 11.9         LABS: No results found for this or any previous visit (from the past 2160 hour(s)).  Radiology: DG Lumbar Spine 2-3 Views  Result Date: 10/29/2018 CLINICAL DATA:  L4-5 fusion. EXAM: LUMBAR SPINE - 2-3 VIEW COMPARISON:  10/28/2018 and MRI 03/27/2018 FINDINGS: Vertebral body heights are normal. There is mild spondylosis throughout the lumbar spine and moderate spondylosis of the lower thoracic spine. Mild stable anterolisthesis of L4 on L5. No evidence of compression fracture. Posterior fusion hardware intact unchanged at the L4-5 level with interbody fusion at the L4-5 level. Remainder the exam is unchanged. IMPRESSION: Mild spondylosis  of the lumbar spine. Stable mild anterolisthesis of L4 on L5. Posterior fusion hardware with interbody fusion of L4-L5 unchanged. Electronically Signed   By: Marin Olp M.D.   On: 10/29/2018 10:05   DG Lumbar Spine 2-3 Views  Result Date: 10/28/2018 CLINICAL DATA:  Intraop views of lumbar interbody fusion. EXAM: LUMBAR SPINE - 2-3 VIEW; DG C-ARM 1-60 MIN COMPARISON:  None. FINDINGS: Thirty intraop fluoroscopic guided images were submitted for review of interbody fusion in the lower lumbar spine with lateral fixation. Fluoro time 1 minutes 57 seconds. IMPRESSION: Intraop views of lumbar spine interbody fusion. Electronically Signed   By: Prudencio Pair M.D.   On: 10/28/2018 19:42   DG C-Arm 1-60 Min  Result Date: 10/28/2018 CLINICAL DATA:  Intraop views of lumbar interbody fusion. EXAM: LUMBAR SPINE - 2-3 VIEW; DG C-ARM 1-60 MIN COMPARISON:  None. FINDINGS: Thirty intraop fluoroscopic guided images were submitted for review of interbody fusion in the lower lumbar spine with lateral fixation. Fluoro time 1 minutes 57 seconds. IMPRESSION: Intraop views of lumbar spine interbody fusion. Electronically Signed   By: Prudencio Pair M.D.   On: 10/28/2018 19:42   DG C-Arm 1-60  Min  Result Date: 10/28/2018 CLINICAL DATA:  Intraop views of lumbar interbody fusion. EXAM: LUMBAR SPINE - 2-3 VIEW; DG C-ARM 1-60 MIN COMPARISON:  None. FINDINGS: Thirty intraop fluoroscopic guided images were submitted for review of interbody fusion in the lower lumbar spine with lateral fixation. Fluoro time 1 minutes 57 seconds. IMPRESSION: Intraop views of lumbar spine interbody fusion. Electronically Signed   By: Prudencio Pair M.D.   On: 10/28/2018 19:42    No results found.  No results found.    Assessment and Plan: Patient Active Problem List   Diagnosis Date Noted   OSA on CPAP 12/29/2019   CPAP use counseling 12/29/2019   Hypertension 11/16/2019   Sleep apnea 11/16/2019   S/P lumbar fusion 10/28/2018   Seizures ([place]) 07/01/2016   Atrial fibrillation ([place]) 09/07/2013   CVA (cerebral vascular accident) ([place]) 09/07/2013   Goiter 09/07/2013      The patient does tolerate PAP and reports benefit from PAP use. The patient was reminded how to adjust mask fit and advised to change supplies regularly. The patient was also counselled on nightly use. The compliance is excellent. The AHI is 0.5.   1. OSA on CPAP Continue excellent compliance  2. CPAP use counseling CPAP couseling-Discussed importance of adequate CPAP use as well as proper care and cleaning techniques of machine and all supplies.  3. Chronic atrial fibrillation ([place]) Followed by cardiology  4. Essential hypertension Elevate din office, continue current medication and f/u with PCP.  5. Obesity (BMI 30.0-34.9) Obesity Counseling: Had a lengthy discussion regarding patients BMI and weight issues. Patient was instructed on portion control as well as increased activity. Also discussed caloric restrictions with trying to maintain intake less than 2000 Kcal. Discussions were made in accordance with the 5As of weight management. Simple actions such as not eating late and if able to, taking a walk is  suggested.    General Counseling: I have discussed the findings of the evaluation and examination with Vaughan Basta.  I have also discussed any further diagnostic evaluation thatmay be needed or ordered today. Lakina verbalizes understanding of the findings of todays visit. We also reviewed her medications today and discussed drug interactions and side effects including but not limited excessive drowsiness and altered mental states. We also discussed that there is always a risk  not just to her but also people around her. she has been encouraged to call the office with any questions or concerns that should arise related to todays visit.  No orders of the defined types were placed in this encounter.       I have personally obtained a history, examined the patient, evaluated laboratory and imaging results, formulated the assessment and plan and placed orders.  This patient was seen by Drema Dallas, PA-C in collaboration with Dr. Devona Konig as a part of collaborative care agreement.  Allyne Gee, MD [facility] Diplomate ABMS Pulmonary Critical Care Medicine and Sleep Medicine

## 2022-01-22 NOTE — Progress Notes (Signed)
[facility] [address], [place] [address]  Pulmonary Sleep Medicine   Office Visit Note  Patient Name: Autumn Herman DOB: 17-Feb-1946 MRN [id number]    Chief Complaint: Obstructive Sleep Apnea visit  Brief History:  Autumn Herman is seen today for an annual follow up visit for APAP @ 5-12 cmH2O. The patient has a 11 year history of sleep apnea. Patient is using PAP nightly.  The patient feels rested after sleeping with PAP.  The patient reports benefiting from PAP use. Reported sleepiness is improved and the Epworth Sleepiness Score is 2 out of 24. The patient does not take naps. The patient complains of the following: none.  The compliance download shows 99% compliance with an average use time of 9 hours 10 minutes. The AHI is 0.4.  The patient does not complain of limb movements disrupting sleep. The patient continues to require PAP therapy in order to eliminate her sleep apnea.   ROS  General: (-) fever, (-) chills, (-) night sweat Nose and Sinuses: (-) nasal stuffiness or itchiness, (-) postnasal drip, (-) nosebleeds, (-) sinus trouble. Mouth and Throat: (-) sore throat, (-) hoarseness. Neck: (-) swollen glands, (-) enlarged thyroid, (-) neck pain. Respiratory: - cough, - shortness of breath, - wheezing. Neurologic: - numbness, - tingling. Psychiatric: - anxiety, - depression   Current Medication: Outpatient Encounter Medications as of 01/23/2022  Medication Sig   glipiZIDE (GLUCOTROL XL) 2.5 MG 24 hr tablet Take 1 tablet by mouth once daily for 90   amLODipine (NORVASC) 10 MG tablet Take 10 mg by mouth daily.   apixaban (ELIQUIS) 5 MG TABS tablet Take 1 tablet by mouth every 12 (twelve) hours.   atorvastatin (LIPITOR) 80 MG tablet Take 80 mg by mouth daily.   Cholecalciferol (VITAMIN D-3 PO) Take 2,000 Units by mouth daily.   Cysteamine Bitartrate (PROCYSBI) 300 MG PACK Use 1 each 3 (three) times daily Use as instructed.   Ferrous Sulfate (IRON) 142 (45 Fe)  MG TBCR Take 1 tablet by mouth daily.   gabapentin (NEURONTIN) 800 MG tablet Take by mouth.   glucose blood ([employer] BLOOD GLUCOSE TEST) test strip Use 1 each (1 strip total) 3 (three) times daily Use as instructed.   lisinopril-hydrochlorothiazide (ZESTORETIC) 20-25 MG tablet Take 1 tablet by mouth daily.   metFORMIN (GLUCOPHAGE) 1000 MG tablet Take 1,000 mg by mouth 2 (two) times daily with a meal.   venlafaxine XR (EFFEXOR-XR) 150 MG 24 hr capsule Take by mouth.   vitamin B-12 (CYANOCOBALAMIN) 1000 MCG tablet Take 1,000 mcg by mouth daily.   [DISCONTINUED] glipiZIDE (GLUCOTROL) 5 MG tablet Take 2.5 mg by mouth daily before breakfast.    No facility-administered encounter medications on file as of 01/23/2022.    Surgical History: Past Surgical History:  Procedure Laterality Date   ANTERIOR LUMBAR FUSION N/A 10/28/2018   Procedure: L4-5 XLIF, L4-5 PSFD (POSTERIOR FUSION WITH DECOMPRESSION);  Surgeon: Meade Maw, MD;  Location: [facility] ORS;  Service: Neurosurgery;  Laterality: N/A;   COLONOSCOPY WITH PROPOFOL N/A 03/10/2017   Procedure: COLONOSCOPY WITH PROPOFOL;  Surgeon: Lollie Sails, MD;  Location: [facility] ENDOSCOPY;  Service: Endoscopy;  Laterality: N/A;   SHOULDER ARTHROSCOPY Left    thyroid tumor      Medical History: Past Medical History:  Diagnosis Date   Atrial fibrillation (HCC)    Back pain    Dyspnea    Dysrhythmia    a fib   Hypertension    Hypertension    Seizures (  [place])    Sleep apnea    Stroke (cerebrum) ([place])     Family History: Non contributory to the present illness  Social History: Social History   Socioeconomic History   Marital status: Divorced    Spouse name: Not on file   Number of children: Not on file   Years of education: Not on file   Highest education level: Not on file  Occupational History   Not on file  Tobacco Use   Smoking status: Never   Smokeless tobacco: Never  Vaping Use   Vaping Use: Never used  Substance and Sexual  Activity   Alcohol use: Not Currently   Drug use: Never   Sexual activity: Not Currently  Other Topics Concern   Not on file  Social History Narrative   Not on file   Social Determinants of Health   Financial Resource Strain: Not on file  Food Insecurity: Not on file  Transportation Needs: Not on file  Physical Activity: Not on file  Stress: Not on file  Social Connections: Not on file  Intimate Partner Violence: Not on file    Vital Signs: Blood pressure (!) 154/79, pulse 68, resp. rate 18, height '5\' 3"'$  (1.6 m), weight 199 lb (90.3 kg), SpO2 96 %. Body mass index is 35.25 kg/m.    Examination: General Appearance: The patient is well-developed, well-nourished, and in no distress. Neck Circumference: 39 cm Skin: Gross inspection of skin unremarkable. Head: normocephalic, no gross deformities. Eyes: no gross deformities noted. ENT: ears appear grossly normal Neurologic: Alert and oriented. No involuntary movements.  STOP BANG RISK ASSESSMENT S (snore) Have you been told that you snore?     NO   T (tired) Are you often tired, fatigued, or sleepy during the day?   NO  O (obstruction) Do you stop breathing, choke, or gasp during sleep? NO   P (pressure) Do you have or are you being treated for high blood pressure? YES   B (BMI) Is your body index greater than 35 kg/m? NO   A (age) Are you 38 years old or older? YES   N (neck) Do you have a neck circumference greater than 16 inches?   NO   G (gender) Are you a female? NO   TOTAL STOP/BANG "YES" ANSWERS 2       A STOP-Bang score of 2 or less is considered low risk, and a score of 5 or more is high risk for having either moderate or severe OSA. For people who score 3 or 4, doctors may need to perform further assessment to determine how likely they are to have OSA.         EPWORTH SLEEPINESS SCALE:  Scale:  (0)= no chance of dozing; (1)= slight chance of dozing; (2)= moderate chance of dozing; (3)= high chance of  dozing  Chance  Situtation    Sitting and reading: 1    Watching TV: 0    Sitting Inactive in public: 0    As a passenger in car: 0      Lying down to rest: 1    Sitting and talking: 0    Sitting quielty after lunch: 0    In a car, stopped in traffic: 0   TOTAL SCORE:   2 out of 24    SLEEP STUDIES:  PSG (02/2010) AHI 7/hr,  min SpO2 89% Titration (02/2010) CPAP@ 9 cmH2O PSG (07/2015) AHI 14/hr, min SpO2 76% Titration (07/2015) CPAP@ 7 cmH2O   CPAP  COMPLIANCE DATA:  Date Range: 01/23/2021-01/22/2022  Average Daily Use: 9 hours 10 minutes  Median Use: 8 hours 36 minutes  Compliance for > 4 Hours: 99%  AHI: 0.4 respiratory events per hour  Days Used: 364/365 days  Mask Leak: 6.1  95th Percentile Pressure: 11.8          LABS: No results found for this or any previous visit (from the past 2160 hour(s)).  Radiology: DG Lumbar Spine 2-3 Views  Result Date: 10/29/2018 CLINICAL DATA:  L4-5 fusion. EXAM: LUMBAR SPINE - 2-3 VIEW COMPARISON:  10/28/2018 and MRI 03/27/2018 FINDINGS: Vertebral body heights are normal. There is mild spondylosis throughout the lumbar spine and moderate spondylosis of the lower thoracic spine. Mild stable anterolisthesis of L4 on L5. No evidence of compression fracture. Posterior fusion hardware intact unchanged at the L4-5 level with interbody fusion at the L4-5 level. Remainder the exam is unchanged. IMPRESSION: Mild spondylosis of the lumbar spine. Stable mild anterolisthesis of L4 on L5. Posterior fusion hardware with interbody fusion of L4-L5 unchanged. Electronically Signed   By: Marin Olp M.D.   On: 10/29/2018 10:05   DG C-Arm 1-60 Min  Result Date: 10/28/2018 CLINICAL DATA:  Intraop views of lumbar interbody fusion. EXAM: LUMBAR SPINE - 2-3 VIEW; DG C-ARM 1-60 MIN COMPARISON:  None. FINDINGS: Thirty intraop fluoroscopic guided images were submitted for review of interbody fusion in the lower lumbar spine with lateral  fixation. Fluoro time 1 minutes 57 seconds. IMPRESSION: Intraop views of lumbar spine interbody fusion. Electronically Signed   By: Prudencio Pair M.D.   On: 10/28/2018 19:42   DG Lumbar Spine 2-3 Views  Result Date: 10/28/2018 CLINICAL DATA:  Intraop views of lumbar interbody fusion. EXAM: LUMBAR SPINE - 2-3 VIEW; DG C-ARM 1-60 MIN COMPARISON:  None. FINDINGS: Thirty intraop fluoroscopic guided images were submitted for review of interbody fusion in the lower lumbar spine with lateral fixation. Fluoro time 1 minutes 57 seconds. IMPRESSION: Intraop views of lumbar spine interbody fusion. Electronically Signed   By: Prudencio Pair M.D.   On: 10/28/2018 19:42   DG C-Arm 1-60 Min  Result Date: 10/28/2018 CLINICAL DATA:  Intraop views of lumbar interbody fusion. EXAM: LUMBAR SPINE - 2-3 VIEW; DG C-ARM 1-60 MIN COMPARISON:  None. FINDINGS: Thirty intraop fluoroscopic guided images were submitted for review of interbody fusion in the lower lumbar spine with lateral fixation. Fluoro time 1 minutes 57 seconds. IMPRESSION: Intraop views of lumbar spine interbody fusion. Electronically Signed   By: Prudencio Pair M.D.   On: 10/28/2018 19:42    No results found.  No results found.    Assessment and Plan: Patient Active Problem List   Diagnosis Date Noted   OSA on CPAP 12/29/2019   CPAP use counseling 12/29/2019   Hypertension 11/16/2019   Sleep apnea 11/16/2019   S/P lumbar fusion 10/28/2018   Seizures ([place]) 07/01/2016   Atrial fibrillation ([place]) 09/07/2013   CVA (cerebral vascular accident) ([place]) 09/07/2013   Goiter 09/07/2013      The patient does tolerate PAP and reports benefit from PAP use. The patient was reminded how to adjust mask fit and advised to change supplies regularly. The patient was also counselled on nightly use. The compliance is excellent. The AHI is 0.4. pt continues to require cpap to treat her osa and is medically necessary.   1. OSA on CPAP Continue excellent  compliance  2. CPAP use counseling CPAP couseling-Discussed importance of adequate CPAP use as well as proper care  and cleaning techniques of machine and all supplies.  3. Chronic atrial fibrillation ([place]) Followed by cardiology  4. Essential hypertension Continue current medication and f/u with PCP.  5. Obesity (BMI 30-39.9) Obesity Counseling: Had a lengthy discussion regarding patients BMI and weight issues. Patient was instructed on portion control as well as increased activity. Also discussed caloric restrictions with trying to maintain intake less than 2000 Kcal. Discussions were made in accordance with the 5As of weight management. Simple actions such as not eating late and if able to, taking a walk is suggested.    General Counseling: I have discussed the findings of the evaluation and examination with Autumn Herman.  I have also discussed any further diagnostic evaluation thatmay be needed or ordered today. Autumn Herman verbalizes understanding of the findings of todays visit. We also reviewed her medications today and discussed drug interactions and side effects including but not limited excessive drowsiness and altered mental states. We also discussed that there is always a risk not just to her but also people around her. she has been encouraged to call the office with any questions or concerns that should arise related to todays visit.  No orders of the defined types were placed in this encounter.       I have personally obtained a history, examined the patient, evaluated laboratory and imaging results, formulated the assessment and plan and placed orders.  This patient was seen by Drema Dallas, PA-C in collaboration with Dr. Devona Konig as a part of collaborative care agreement.  Allyne Gee, MD [facility] Diplomate ABMS Pulmonary Critical Care Medicine and Sleep Medicine

## 2022-01-23 ENCOUNTER — Ambulatory Visit (INDEPENDENT_AMBULATORY_CARE_PROVIDER_SITE_OTHER): Payer: Medicare Other | Admitting: Internal Medicine

## 2022-01-23 VITALS — BP 154/79 | HR 68 | Resp 18 | Ht 63.0 in | Wt 199.0 lb

## 2022-01-23 DIAGNOSIS — I482 Chronic atrial fibrillation, unspecified: Secondary | ICD-10-CM | POA: Diagnosis not present

## 2022-01-23 DIAGNOSIS — E669 Obesity, unspecified: Secondary | ICD-10-CM

## 2022-01-23 DIAGNOSIS — G4733 Obstructive sleep apnea (adult) (pediatric): Secondary | ICD-10-CM | POA: Diagnosis not present

## 2022-01-23 DIAGNOSIS — I10 Essential (primary) hypertension: Secondary | ICD-10-CM

## 2022-01-23 DIAGNOSIS — Z7189 Other specified counseling: Secondary | ICD-10-CM | POA: Diagnosis not present

## 2022-01-23 NOTE — Patient Instructions (Signed)

## 2022-07-17 ENCOUNTER — Ambulatory Visit (INDEPENDENT_AMBULATORY_CARE_PROVIDER_SITE_OTHER): Payer: Medicare Other | Admitting: Internal Medicine

## 2022-07-17 VITALS — BP 131/92 | HR 61 | Resp 18 | Ht 62.0 in | Wt 184.0 lb

## 2022-07-17 DIAGNOSIS — I10 Essential (primary) hypertension: Secondary | ICD-10-CM

## 2022-07-17 DIAGNOSIS — I482 Chronic atrial fibrillation, unspecified: Secondary | ICD-10-CM | POA: Diagnosis not present

## 2022-07-17 DIAGNOSIS — G4733 Obstructive sleep apnea (adult) (pediatric): Secondary | ICD-10-CM

## 2022-07-17 DIAGNOSIS — Z7189 Other specified counseling: Secondary | ICD-10-CM

## 2022-07-17 DIAGNOSIS — E669 Obesity, unspecified: Secondary | ICD-10-CM

## 2022-07-17 NOTE — Progress Notes (Signed)
[facility] [address] [place], [place] [address]  Pulmonary Sleep Medicine   Office Visit Note  Patient Name: Autumn Herman DOB: February 06, 1946 MRN [id number]    Chief Complaint: Obstructive Sleep Apnea visit  Brief History:  Tawnia is seen today for a follow up visit for replacement APAP@ 5-12 cmH2O. The patient has a 12 year history of sleep apnea. Patient is using PAP nightly.  The patient feels rested after sleeping with PAP.  The patient reports benefiting from PAP use. Reported sleepiness is  improved and the Epworth Sleepiness Score is 4 out of 24. The patient does not take naps. The patient complains of the following: none.  The compliance download shows 100% compliance with an average use time of 9 hours 57 minutes. The AHI is 0.4.  The patient does not complain of limb movements disrupting sleep. The patient continues to require PAP therapy in order to eliminate sleep apnea.   ROS  General: (-) fever, (-) chills, (-) night sweat Nose and Sinuses: (-) nasal stuffiness or itchiness, (-) postnasal drip, (-) nosebleeds, (-) sinus trouble. Mouth and Throat: (-) sore throat, (-) hoarseness. Neck: (-) swollen glands, (-) enlarged thyroid, (-) neck pain. Respiratory: - cough, - shortness of breath, - wheezing. Neurologic: + numbness, + tingling. Psychiatric: - anxiety, - depression   Current Medication: Outpatient Encounter Medications as of 07/17/2022  Medication Sig   amLODipine (NORVASC) 10 MG tablet Take 10 mg by mouth daily.   apixaban (ELIQUIS) 5 MG TABS tablet Take 1 tablet by mouth every 12 (twelve) hours.   atorvastatin (LIPITOR) 80 MG tablet Take 80 mg by mouth daily.   Cholecalciferol (VITAMIN D-3 PO) Take 2,000 Units by mouth daily.   Cysteamine Bitartrate (PROCYSBI) 300 MG PACK Use 1 each 3 (three) times daily Use as instructed.   Ferrous Sulfate (IRON) 142 (45 Fe) MG TBCR Take 1 tablet by mouth daily.   gabapentin (NEURONTIN) 800 MG tablet Take by  mouth.   glipiZIDE (GLUCOTROL XL) 2.5 MG 24 hr tablet Take 1 tablet by mouth once daily for 90   glucose blood ([employer] BLOOD GLUCOSE TEST) test strip Use 1 each (1 strip total) 3 (three) times daily Use as instructed.   lisinopril-hydrochlorothiazide (ZESTORETIC) 20-25 MG tablet Take 1 tablet by mouth daily.   metFORMIN (GLUCOPHAGE) 1000 MG tablet Take 1,000 mg by mouth 2 (two) times daily with a meal.   venlafaxine XR (EFFEXOR-XR) 150 MG 24 hr capsule Take by mouth.   vitamin B-12 (CYANOCOBALAMIN) 1000 MCG tablet Take 1,000 mcg by mouth daily.   No facility-administered encounter medications on file as of 07/17/2022.    Surgical History: Past Surgical History:  Procedure Laterality Date   ANTERIOR LUMBAR FUSION N/A 10/28/2018   Procedure: L4-5 XLIF, L4-5 PSFD (POSTERIOR FUSION WITH DECOMPRESSION);  Surgeon: Venetia Night, MD;  Location: [facility] ORS;  Service: Neurosurgery;  Laterality: N/A;   COLONOSCOPY WITH PROPOFOL N/A 03/10/2017   Procedure: COLONOSCOPY WITH PROPOFOL;  Surgeon: Christena Deem, MD;  Location: [facility] ENDOSCOPY;  Service: Endoscopy;  Laterality: N/A;   SHOULDER ARTHROSCOPY Left    thyroid tumor      Medical History: Past Medical History:  Diagnosis Date   Atrial fibrillation (HCC)    Back pain    Dyspnea    Dysrhythmia    a fib   Hypertension    Hypertension    Seizures (HCC)    Sleep apnea    Stroke (cerebrum) (HCC)     Family History:  Non contributory to the present illness  Social History: Social History   Socioeconomic History   Marital status: Divorced    Spouse name: Not on file   Number of children: Not on file   Years of education: Not on file   Highest education level: Not on file  Occupational History   Not on file  Tobacco Use   Smoking status: Never   Smokeless tobacco: Never  Vaping Use   Vaping Use: Never used  Substance and Sexual Activity   Alcohol use: Not Currently   Drug use: Never   Sexual activity: Not Currently   Other Topics Concern   Not on file  Social History Narrative   Not on file   Social Determinants of Health   Financial Resource Strain: Not on file  Food Insecurity: Not on file  Transportation Needs: Not on file  Physical Activity: Not on file  Stress: Not on file  Social Connections: Not on file  Intimate Partner Violence: Not on file    Vital Signs: Blood pressure (!) 131/92, pulse 61, resp. rate 18, height 5\' 2"  (1.575 m), weight 184 lb (83.5 kg), SpO2 96 %. Body mass index is 33.65 kg/m.    Examination: General Appearance: The patient is well-developed, well-nourished, and in no distress. Neck Circumference: 39 cm Skin: Gross inspection of skin unremarkable. Head: normocephalic, no gross deformities. Eyes: no gross deformities noted. ENT: ears appear grossly normal Neurologic: Alert and oriented. No involuntary movements.  STOP BANG RISK ASSESSMENT S (snore) Have you been told that you snore?     NO   T (tired) Are you often tired, fatigued, or sleepy during the day?   NO  O (obstruction) Do you stop breathing, choke, or gasp during sleep? NO   P (pressure) Do you have or are you being treated for high blood pressure? YES   B (BMI) Is your body index greater than 35 kg/m? NO   A (age) Are you 38 years old or older? YES   N (neck) Do you have a neck circumference greater than 16 inches?   NO   G (gender) Are you a female? NO   TOTAL STOP/BANG "YES" ANSWERS 2       A STOP-Bang score of 2 or less is considered low risk, and a score of 5 or more is high risk for having either moderate or severe OSA. For people who score 3 or 4, doctors may need to perform further assessment to determine how likely they are to have OSA.         EPWORTH SLEEPINESS SCALE:  Scale:  (0)= no chance of dozing; (1)= slight chance of dozing; (2)= moderate chance of dozing; (3)= high chance of dozing  Chance  Situtation    Sitting and reading: 1    Watching TV: 1    Sitting  Inactive in public: 0    As a passenger in car: 0      Lying down to rest: 1    Sitting and talking: 0    Sitting quielty after lunch: 1    In a car, stopped in traffic: 0   TOTAL SCORE:   4 out of 24    SLEEP STUDIES:  PSG (02/2010) AHI 7/hr, min SpO2 89% Titration (02/2010) CPAP@ 9 cmH2O PSG (07/2015) AHI 14/hr, min SpO2 76% Titration (07/2015) CPAP@ 7 cmH2O PSG (11/2019) AHI 6.4/hr, REM AHI 16.2/hr, min SpO2 82%, PLMI 177   CPAP COMPLIANCE DATA:  Date Range: 05/13/2022-07/11/2022  Average Daily Use: 9 hours 57 minutes  Median Use: 10 hours 13 minutes  Compliance for > 4 Hours: 100%  AHI: 0.4 respiratory events per hour  Days Used: 60/60 days  Mask Leak: 1.1  95th Percentile Pressure: 10.5         LABS: No results found for this or any previous visit (from the past 2160 hour(s)).  Radiology: DG Lumbar Spine 2-3 Views  Result Date: 10/29/2018 CLINICAL DATA:  L4-5 fusion. EXAM: LUMBAR SPINE - 2-3 VIEW COMPARISON:  10/28/2018 and MRI 03/27/2018 FINDINGS: Vertebral body heights are normal. There is mild spondylosis throughout the lumbar spine and moderate spondylosis of the lower thoracic spine. Mild stable anterolisthesis of L4 on L5. No evidence of compression fracture. Posterior fusion hardware intact unchanged at the L4-5 level with interbody fusion at the L4-5 level. Remainder the exam is unchanged. IMPRESSION: Mild spondylosis of the lumbar spine. Stable mild anterolisthesis of L4 on L5. Posterior fusion hardware with interbody fusion of L4-L5 unchanged. Electronically Signed   By: Elberta Fortis M.D.   On: 10/29/2018 10:05   DG C-Arm 1-60 Min  Result Date: 10/28/2018 CLINICAL DATA:  Intraop views of lumbar interbody fusion. EXAM: LUMBAR SPINE - 2-3 VIEW; DG C-ARM 1-60 MIN COMPARISON:  None. FINDINGS: Thirty intraop fluoroscopic guided images were submitted for review of interbody fusion in the lower lumbar spine with lateral fixation. Fluoro time 1  minutes 57 seconds. IMPRESSION: Intraop views of lumbar spine interbody fusion. Electronically Signed   By: Jonna Clark M.D.   On: 10/28/2018 19:42   DG Lumbar Spine 2-3 Views  Result Date: 10/28/2018 CLINICAL DATA:  Intraop views of lumbar interbody fusion. EXAM: LUMBAR SPINE - 2-3 VIEW; DG C-ARM 1-60 MIN COMPARISON:  None. FINDINGS: Thirty intraop fluoroscopic guided images were submitted for review of interbody fusion in the lower lumbar spine with lateral fixation. Fluoro time 1 minutes 57 seconds. IMPRESSION: Intraop views of lumbar spine interbody fusion. Electronically Signed   By: Jonna Clark M.D.   On: 10/28/2018 19:42   DG C-Arm 1-60 Min  Result Date: 10/28/2018 CLINICAL DATA:  Intraop views of lumbar interbody fusion. EXAM: LUMBAR SPINE - 2-3 VIEW; DG C-ARM 1-60 MIN COMPARISON:  None. FINDINGS: Thirty intraop fluoroscopic guided images were submitted for review of interbody fusion in the lower lumbar spine with lateral fixation. Fluoro time 1 minutes 57 seconds. IMPRESSION: Intraop views of lumbar spine interbody fusion. Electronically Signed   By: Jonna Clark M.D.   On: 10/28/2018 19:42    No results found.  No results found.    Assessment and Plan: Patient Active Problem List   Diagnosis Date Noted   OSA on CPAP 12/29/2019   CPAP use counseling 12/29/2019   Hypertension 11/16/2019   Sleep apnea 11/16/2019   S/P lumbar fusion 10/28/2018   Seizures (HCC) 07/01/2016   Atrial fibrillation (HCC) 09/07/2013   CVA (cerebral vascular accident) (HCC) 09/07/2013   Goiter 09/07/2013      The patient does tolerate PAP and reports benefit from PAP use. The patient was reminded how to adjust mask fit and advised to change supplies regularly. The patient was also counselled on nightly use. The compliance is excellent. The AHI is 0.4. Patient continues to require PAP to treat their apnea and is medically necessary.   1. OSA on CPAP Continue excellent compliance  2. CPAP use  counseling CPAP couseling-Discussed importance of adequate CPAP use as well as proper care and cleaning techniques of machine and all supplies.  3. Chronic atrial fibrillation (HCC) Followed by cardiology  4. Essential hypertension Continue current medication and f/u with PCP.  5. Obesity (BMI 30-39.9) Obesity Counseling: Had a lengthy discussion regarding patients BMI and weight issues. Patient was instructed on portion control as well as increased activity. Also discussed caloric restrictions with trying to maintain intake less than 2000 Kcal. Discussions were made in accordance with the 5As of weight management. Simple actions such as not eating late and if able to, taking a walk is suggested.    General Counseling: I have discussed the findings of the evaluation and examination with Bonita Quin.  I have also discussed any further diagnostic evaluation thatmay be needed or ordered today. Nioma verbalizes understanding of the findings of todays visit. We also reviewed her medications today and discussed drug interactions and side effects including but not limited excessive drowsiness and altered mental states. We also discussed that there is always a risk not just to her but also people around her. she has been encouraged to call the office with any questions or concerns that should arise related to todays visit.  No orders of the defined types were placed in this encounter.       I have personally obtained a history, examined the patient, evaluated laboratory and imaging results, formulated the assessment and plan and placed orders.  This patient was seen by Lynn Ito, PA-C in collaboration with Dr. Freda Munro as a part of collaborative care agreement.  Yevonne Pax, MD [facility] Diplomate ABMS Pulmonary Critical Care Medicine and Sleep Medicine

## 2022-07-17 NOTE — Patient Instructions (Signed)

## 2023-09-15 NOTE — Progress Notes (Signed)
 No show

## 2023-09-16 ENCOUNTER — Ambulatory Visit: Admitting: Internal Medicine
